# Patient Record
Sex: Female | Born: 1990 | Race: Black or African American | Hispanic: No | Marital: Single | Smoking: Never smoker
Health system: Southern US, Community
[De-identification: ages and names within clinical notes are randomized; demographics above are authoritative.]

## PROBLEM LIST (undated history)

## (undated) DIAGNOSIS — O139 Gestational [pregnancy-induced] hypertension without significant proteinuria, unspecified trimester: Secondary | ICD-10-CM

## (undated) DIAGNOSIS — Z5189 Encounter for other specified aftercare: Secondary | ICD-10-CM

## (undated) HISTORY — DX: Encounter for other specified aftercare: Z51.89

## (undated) HISTORY — PX: HAND SURGERY: SHX662

## (undated) HISTORY — DX: Gestational (pregnancy-induced) hypertension without significant proteinuria, unspecified trimester: O13.9

---
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
## 2009-06-02 ENCOUNTER — Emergency Department: Payer: Self-pay | Admitting: Emergency Medicine

## 2009-06-20 ENCOUNTER — Emergency Department: Payer: Self-pay | Admitting: Internal Medicine

## 2011-01-10 ENCOUNTER — Emergency Department: Payer: Self-pay | Admitting: Unknown Physician Specialty

## 2011-04-02 ENCOUNTER — Emergency Department: Payer: Self-pay | Admitting: Emergency Medicine

## 2011-04-02 LAB — URINALYSIS, COMPLETE
Bilirubin,UR: NEGATIVE
Blood: NEGATIVE
Glucose,UR: NEGATIVE mg/dL (ref 0–75)
Hyaline Cast: 2
Nitrite: NEGATIVE
Ph: 5 (ref 4.5–8.0)
Protein: NEGATIVE
RBC,UR: 16 /HPF (ref 0–5)
Specific Gravity: 1.03 (ref 1.003–1.030)
Squamous Epithelial: 13
WBC UR: 19 /HPF (ref 0–5)

## 2011-04-02 LAB — PREGNANCY, URINE: Pregnancy Test, Urine: POSITIVE m[IU]/mL

## 2011-04-02 LAB — HCG, QUANTITATIVE, PREGNANCY: Beta Hcg, Quant.: 2212 m[IU]/mL — ABNORMAL HIGH

## 2011-04-22 ENCOUNTER — Emergency Department: Payer: Self-pay | Admitting: Emergency Medicine

## 2011-04-22 LAB — URINALYSIS, COMPLETE
Bacteria: NONE SEEN
Bilirubin,UR: NEGATIVE
Hyaline Cast: 1
Ketone: NEGATIVE
Nitrite: NEGATIVE
RBC,UR: 7 /HPF (ref 0–5)
Specific Gravity: 1.025 (ref 1.003–1.030)
Squamous Epithelial: 6
WBC UR: 9 /HPF (ref 0–5)

## 2011-04-22 LAB — COMPREHENSIVE METABOLIC PANEL
Alkaline Phosphatase: 39 U/L — ABNORMAL LOW (ref 50–136)
Bilirubin,Total: 0.3 mg/dL (ref 0.2–1.0)
Calcium, Total: 8.5 mg/dL (ref 8.5–10.1)
Chloride: 106 mmol/L (ref 98–107)
Co2: 25 mmol/L (ref 21–32)
EGFR (African American): >60
EGFR (Non-African Amer.): >60
Osmolality: 277 (ref 275–301)
Potassium: 3.8 mmol/L (ref 3.5–5.1)
SGOT(AST): 16 U/L (ref 15–37)
Total Protein: 7.3 g/dL (ref 6.4–8.2)

## 2011-04-22 LAB — HCG, QUANTITATIVE, PREGNANCY: Beta Hcg, Quant.: 114693 m[IU]/mL — ABNORMAL HIGH

## 2011-04-22 LAB — CBC WITH DIFFERENTIAL/PLATELET
Basophil %: 0.2 %
Eosinophil #: 0.1 10*3/uL (ref 0.0–0.7)
Eosinophil %: 1 %
MCH: 27.1 pg (ref 26.0–34.0)
MCV: 83 fL (ref 80–100)
Monocyte %: 6 %
RBC: 4.41 10*6/uL (ref 3.80–5.20)
WBC: 9.6 10*3/uL (ref 3.6–11.0)

## 2011-04-23 LAB — URINE CULTURE

## 2011-06-17 ENCOUNTER — Emergency Department: Payer: Self-pay | Admitting: Emergency Medicine

## 2011-06-17 LAB — URINALYSIS, COMPLETE
Bilirubin,UR: NEGATIVE
Nitrite: NEGATIVE
Protein: 30
Specific Gravity: 1.029 (ref 1.003–1.030)

## 2011-09-10 ENCOUNTER — Observation Stay: Payer: Self-pay | Admitting: Obstetrics and Gynecology

## 2011-09-10 LAB — URINALYSIS, COMPLETE
Bilirubin,UR: NEGATIVE
Blood: NEGATIVE
Glucose,UR: NEGATIVE mg/dL (ref 0–75)
Nitrite: NEGATIVE
Ph: 5 (ref 4.5–8.0)
Protein: 30
RBC,UR: 2 /HPF (ref 0–5)
Specific Gravity: 1.03 (ref 1.003–1.030)
Squamous Epithelial: 6
WBC UR: 10 /HPF (ref 0–5)

## 2011-09-10 LAB — CBC WITH DIFFERENTIAL/PLATELET
Basophil #: 0 10*3/uL (ref 0.0–0.1)
Basophil %: 0.6 %
Eosinophil #: 0 10*3/uL (ref 0.0–0.7)
Eosinophil %: 0.1 %
HCT: 34.3 % — ABNORMAL LOW (ref 35.0–47.0)
HGB: 11.8 g/dL — ABNORMAL LOW (ref 12.0–16.0)
Lymphocyte #: 0.6 10*3/uL — ABNORMAL LOW (ref 1.0–3.6)
Lymphocyte %: 7.3 %
MCH: 28 pg (ref 26.0–34.0)
MCHC: 34.3 g/dL (ref 32.0–36.0)
MCV: 82 fL (ref 80–100)
Monocyte #: 0.6 x10 3/mm (ref 0.2–0.9)
Monocyte %: 7.1 %
Neutrophil #: 6.9 10*3/uL — ABNORMAL HIGH (ref 1.4–6.5)
Neutrophil %: 84.9 %
Platelet: 196 10*3/uL (ref 150–440)
RBC: 4.2 10*6/uL (ref 3.80–5.20)
RDW: 12.9 % (ref 11.5–14.5)
WBC: 8.1 10*3/uL (ref 3.6–11.0)

## 2011-09-10 LAB — BASIC METABOLIC PANEL
Anion Gap: 13 (ref 7–16)
BUN: 6 mg/dL — ABNORMAL LOW (ref 7–18)
Calcium, Total: 8.7 mg/dL (ref 8.5–10.1)
Chloride: 103 mmol/L (ref 98–107)
Co2: 20 mmol/L — ABNORMAL LOW (ref 21–32)
Creatinine: 0.4 mg/dL — ABNORMAL LOW (ref 0.60–1.30)
EGFR (African American): >60
EGFR (Non-African Amer.): >60
Glucose: 88 mg/dL (ref 65–99)
Osmolality: 269 (ref 275–301)
Potassium: 3.6 mmol/L (ref 3.5–5.1)
Sodium: 136 mmol/L (ref 136–145)

## 2011-09-11 LAB — URINE CULTURE

## 2011-10-23 ENCOUNTER — Observation Stay: Payer: Self-pay

## 2011-10-24 ENCOUNTER — Observation Stay: Payer: Self-pay

## 2011-10-31 ENCOUNTER — Observation Stay: Payer: Self-pay | Admitting: Obstetrics and Gynecology

## 2011-11-01 ENCOUNTER — Inpatient Hospital Stay: Payer: Self-pay | Admitting: Obstetrics and Gynecology

## 2011-11-01 LAB — CBC WITH DIFFERENTIAL/PLATELET
Basophil #: 0.1 10*3/uL (ref 0.0–0.1)
Basophil %: 0.3 %
Eosinophil #: 0 10*3/uL (ref 0.0–0.7)
Eosinophil %: 0.1 %
HCT: 32 % — ABNORMAL LOW (ref 35.0–47.0)
HGB: 11.2 g/dL — ABNORMAL LOW (ref 12.0–16.0)
Lymphocyte #: 1.5 10*3/uL (ref 1.0–3.6)
Lymphocyte %: 8 %
MCH: 27.8 pg (ref 26.0–34.0)
MCHC: 34.9 g/dL (ref 32.0–36.0)
MCV: 80 fL (ref 80–100)
Monocyte #: 1.5 x10 3/mm — ABNORMAL HIGH (ref 0.2–0.9)
Monocyte %: 7.8 %
Neutrophil #: 15.9 10*3/uL — ABNORMAL HIGH (ref 1.4–6.5)
Neutrophil %: 83.8 %
Platelet: 211 10*3/uL (ref 150–440)
RBC: 4.02 10*6/uL (ref 3.80–5.20)
RDW: 12.5 % (ref 11.5–14.5)
WBC: 19 10*3/uL — ABNORMAL HIGH (ref 3.6–11.0)

## 2011-11-02 LAB — HEMATOCRIT: HCT: 30.8 % — ABNORMAL LOW (ref 35.0–47.0)

## 2012-07-19 ENCOUNTER — Emergency Department: Payer: Self-pay | Admitting: Emergency Medicine

## 2012-07-19 LAB — URINALYSIS, COMPLETE
Glucose,UR: NEGATIVE mg/dL (ref 0–75)
Ketone: NEGATIVE
Nitrite: NEGATIVE
Ph: 6 (ref 4.5–8.0)
Protein: NEGATIVE
RBC,UR: 145 /HPF (ref 0–5)
Specific Gravity: 1.02 (ref 1.003–1.030)
Squamous Epithelial: 3
WBC UR: 111 /HPF (ref 0–5)

## 2012-07-21 LAB — URINE CULTURE

## 2012-11-26 IMAGING — US US OB < 14 WEEKS
1 series · 17 of 28 positions shown · non-contrast
Comparison: none

REASON FOR EXAM: early preg, cramping, bleeding
COMMENTS:

[Series 1: us ob < 14 weeks · 71 acquisitions, 17 frames shown]
[im 1/71]
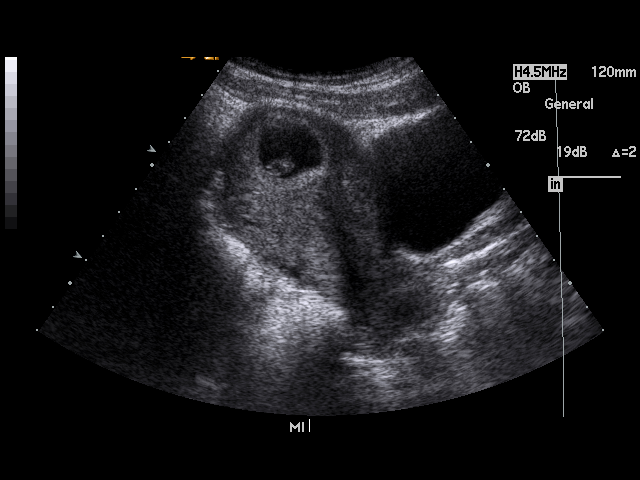
[im 6/71]
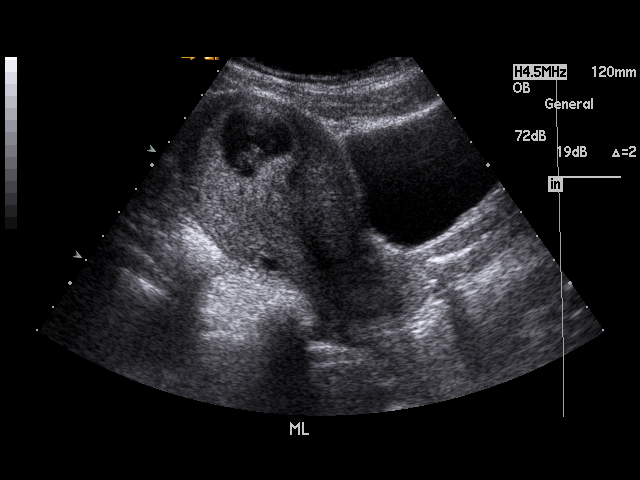
[im 11/71]
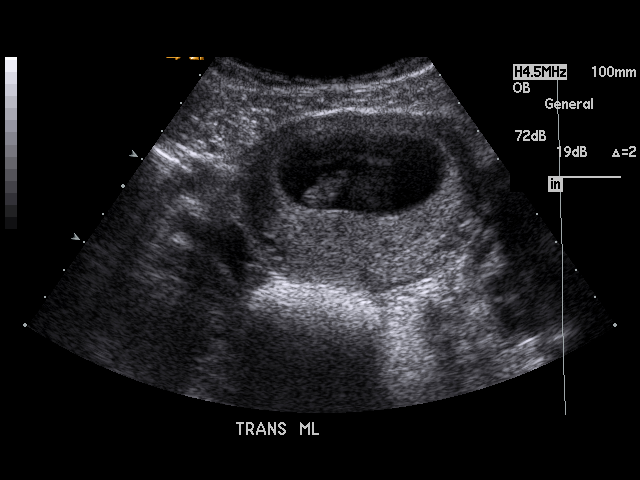
[im 13/71]
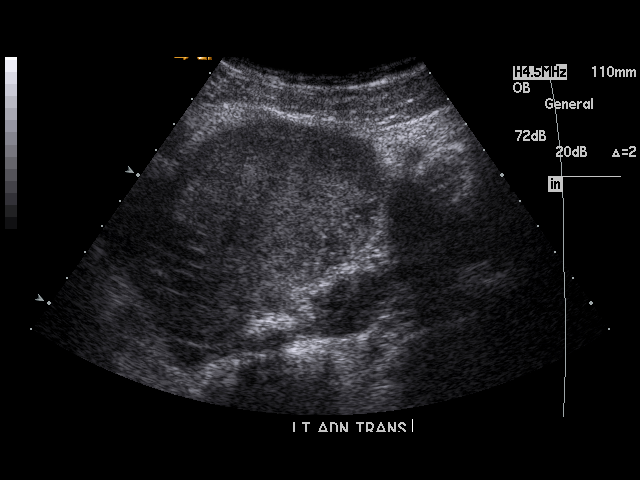
[im 19/71]
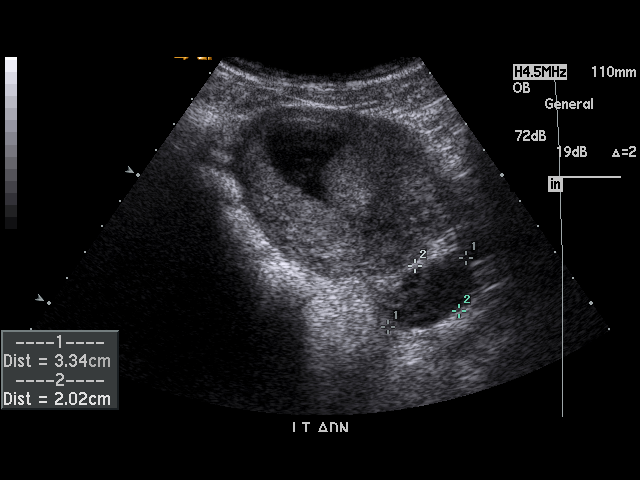
[im 24/71]
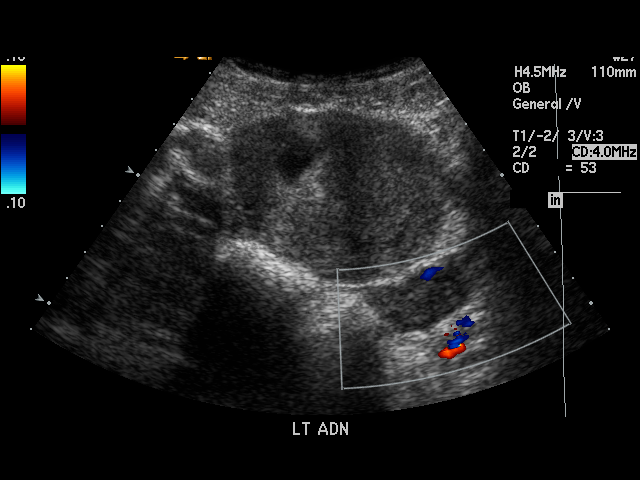
[im 26/71]
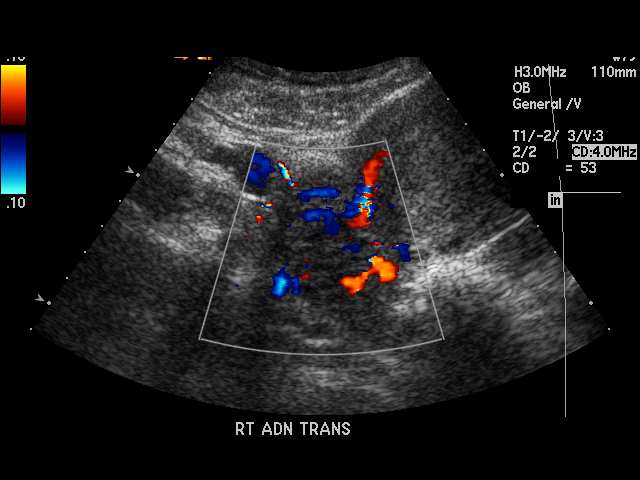
[im 32/71]
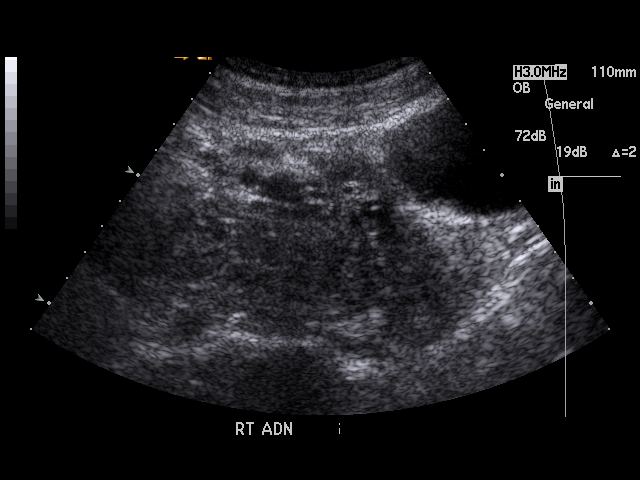
[im 37/71]
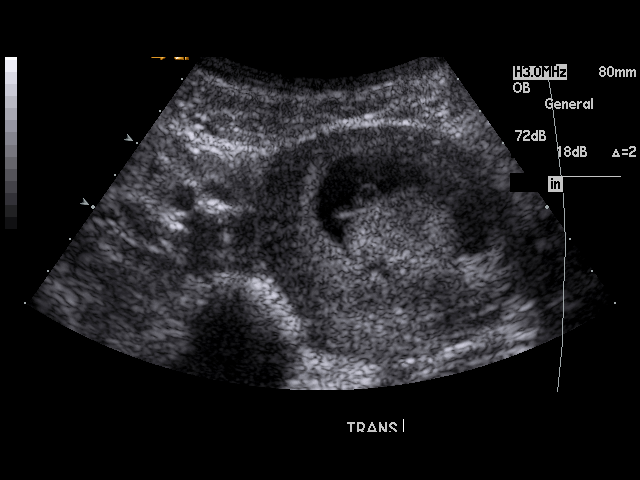
[im 39/71]
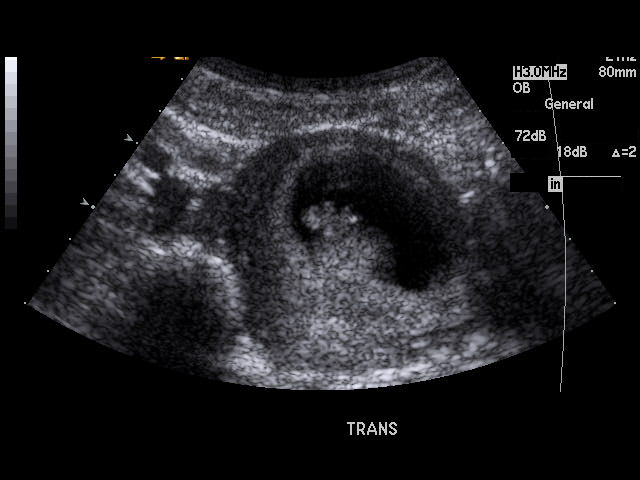
[im 45/71]
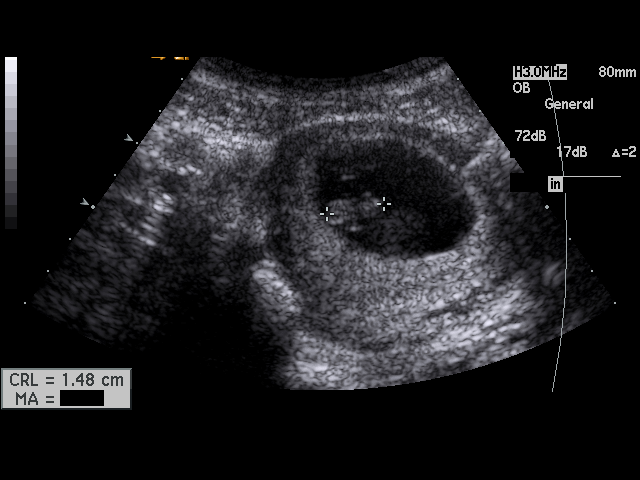
[im 47/71]
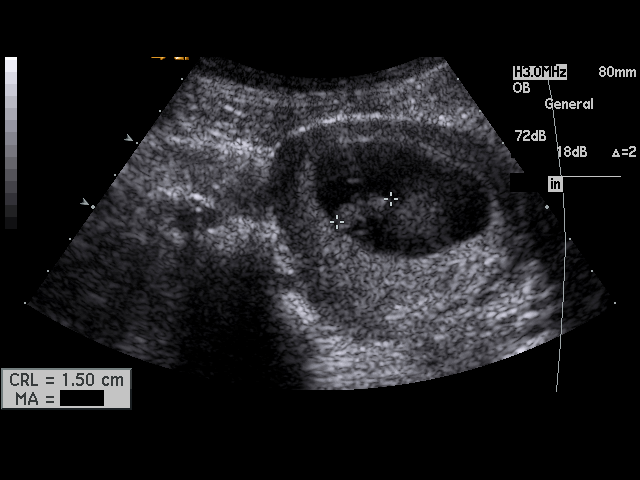
[im 52/71]
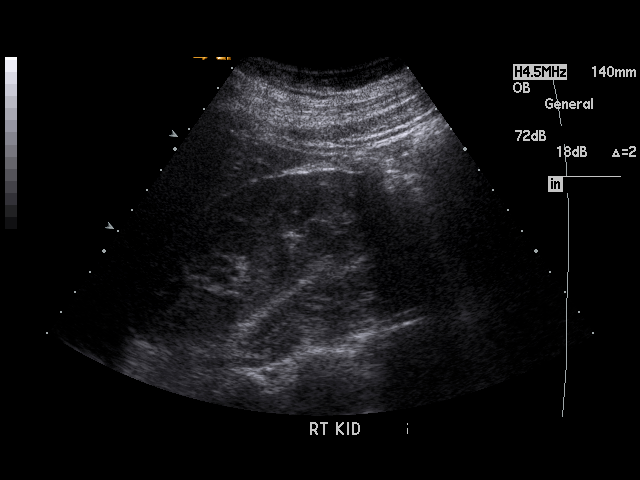
[im 58/71]
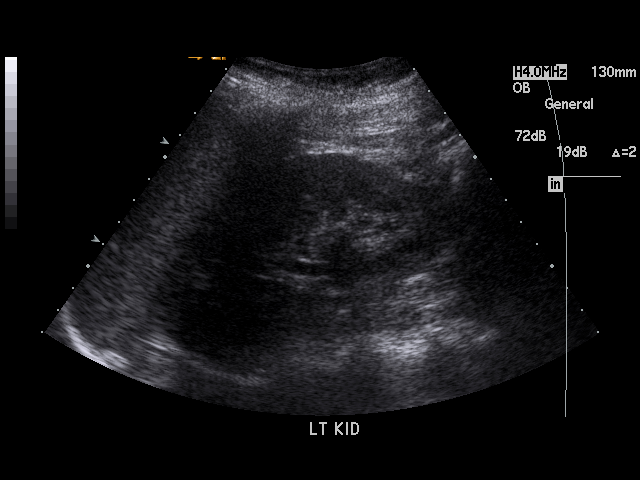
[im 60/71]
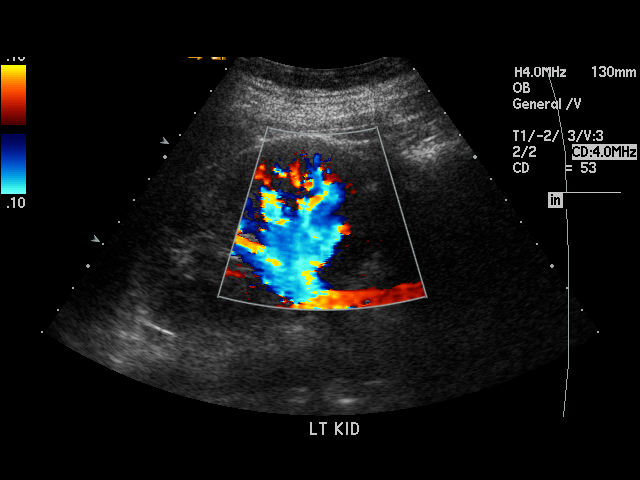
[im 65/71]
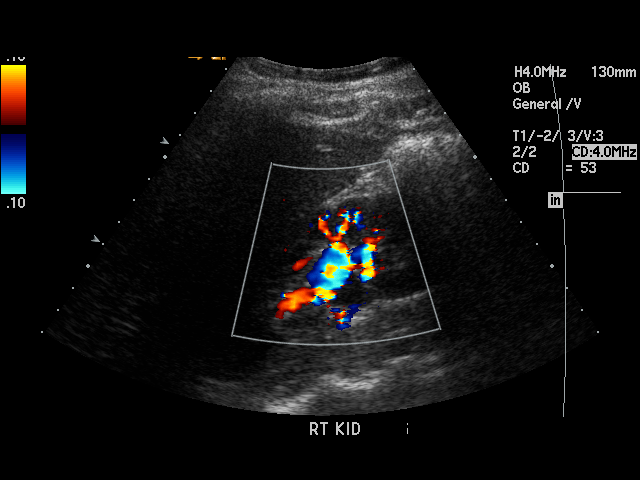
[im 71/71]
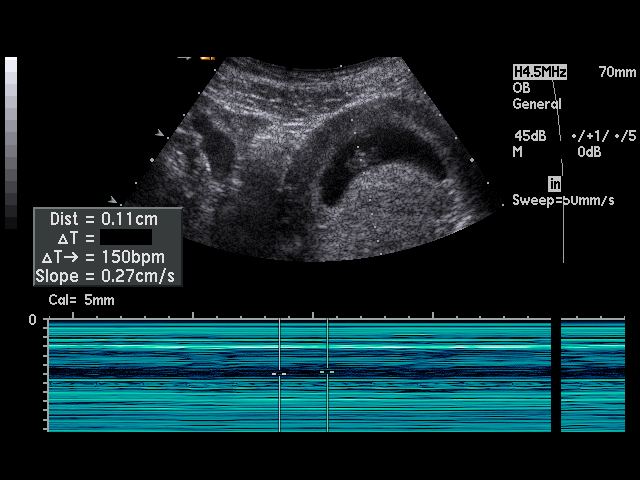

[17 of 28 positions shown; findings below may reference images not displayed]

PROCEDURE:     US  - US OB LESS THAN 14 WEEKS  - April 22, 2011 [DATE]

RESULT:     There is observed a single living intrauterine gestation. Embryo
heart rate was monitored at 150 beats per minute. A normal-appearing yolk
sac is visualized. The crown-rump length measures 1.5 cm which corresponds
to an estimated menstrual age of 7 weeks-3 days. Ultrasound EDD is Zelf

The maternal ovaries are visualized bilaterally. The right ovary measures
3.3 cm at maximum diameter and the left ovary also measures 3.3 cm at
maximum diameter. No abnormal adnexal masses are seen. No free fluid is
observed in the pelvis.
IMPRESSION: 1. There is a living intrauterine embryo of approximately 7 weeks-3 days
menstrual age.
2. A normal-appearing yolk sac is visualized.
3. No subchorionic bleed is identified.
4. The maternal pelvis is normal in appearance sonographically.
5. The maternal kidneys are visualized bilaterally with no hydronephrosis
identified.

## 2013-03-30 ENCOUNTER — Emergency Department: Payer: Self-pay | Admitting: Emergency Medicine

## 2013-10-11 ENCOUNTER — Emergency Department: Payer: Self-pay | Admitting: Emergency Medicine

## 2013-11-04 ENCOUNTER — Observation Stay: Payer: Self-pay

## 2013-11-06 ENCOUNTER — Inpatient Hospital Stay: Payer: Self-pay

## 2013-11-06 LAB — PIH PROFILE
Anion Gap: 11 (ref 7–16)
BUN: 3 mg/dL — ABNORMAL LOW (ref 7–18)
CALCIUM: 8.3 mg/dL — AB (ref 8.5–10.1)
CHLORIDE: 107 mmol/L (ref 98–107)
CO2: 22 mmol/L (ref 21–32)
Creatinine: 0.6 mg/dL (ref 0.60–1.30)
GLUCOSE: 92 mg/dL (ref 65–99)
HCT: 30.8 % — ABNORMAL LOW (ref 35.0–47.0)
HGB: 9.9 g/dL — AB (ref 12.0–16.0)
MCH: 25.3 pg — AB (ref 26.0–34.0)
MCHC: 32.1 g/dL (ref 32.0–36.0)
MCV: 79 fL — ABNORMAL LOW (ref 80–100)
OSMOLALITY: 276 (ref 275–301)
Platelet: 198 10*3/uL (ref 150–440)
Potassium: 3.5 mmol/L (ref 3.5–5.1)
RBC: 3.91 10*6/uL (ref 3.80–5.20)
RDW: 13.2 % (ref 11.5–14.5)
SGOT(AST): 11 U/L — ABNORMAL LOW (ref 15–37)
Sodium: 140 mmol/L (ref 136–145)
Uric Acid: 3.6 mg/dL (ref 2.6–6.0)
WBC: 7.6 10*3/uL (ref 3.6–11.0)

## 2013-11-06 LAB — PROTEIN / CREATININE RATIO, URINE
CREATININE, URINE: 192.9 mg/dL — AB (ref 30.0–125.0)
PROTEIN/CREAT. RATIO: 187 mg/g{creat} (ref 0–200)
Protein, Random Urine: 36 mg/dL — ABNORMAL HIGH (ref 0–12)

## 2013-11-06 LAB — DRUG SCREEN, URINE
AMPHETAMINES, UR SCREEN: NEGATIVE (ref ?–1000)
BARBITURATES, UR SCREEN: NEGATIVE (ref ?–200)
BENZODIAZEPINE, UR SCRN: NEGATIVE (ref ?–200)
Cannabinoid 50 Ng, Ur ~~LOC~~: NEGATIVE (ref ?–50)
Cocaine Metabolite,Ur ~~LOC~~: NEGATIVE (ref ?–300)
MDMA (ECSTASY) UR SCREEN: NEGATIVE (ref ?–500)
Methadone, Ur Screen: NEGATIVE (ref ?–300)
Opiate, Ur Screen: NEGATIVE (ref ?–300)
PHENCYCLIDINE (PCP) UR S: NEGATIVE (ref ?–25)
TRICYCLIC, UR SCREEN: NEGATIVE (ref ?–1000)

## 2013-11-08 LAB — HEMATOCRIT: HCT: 29.7 % — ABNORMAL LOW (ref 35.0–47.0)

## 2013-11-10 LAB — GC/CHLAMYDIA PROBE AMP

## 2014-02-06 ENCOUNTER — Emergency Department: Payer: Self-pay | Admitting: Emergency Medicine

## 2014-02-09 LAB — BETA STREP CULTURE(ARMC)

## 2014-04-30 ENCOUNTER — Emergency Department: Admit: 2014-04-30 | Disposition: A | Discharge status: Home / Self Care | Payer: Self-pay | Admitting: Family Medicine

## 2014-05-02 LAB — BETA STREP CULTURE(ARMC)

## 2014-05-24 NOTE — H&P (Signed)
L&D Evaluation:  History:  HPI Pt is a 24 yo G2P0101 at 37.[redacted] weeks GA with an EDC of 11/24/13 who presents for IOL for gestational HTN. She has had several documented BP that have had an elevate diastolic at 90 at a 36.2 and 36.5 week visit. At her last visit on 10/23 her BP was 140/92. She had a PIH work up that was normal 5 days ago. She reports a headache and nausea at her visit on 10/23. Her prenatal course has been unremarkable. She has a history of PTB at 34 weeks. She is A+, RI, VI, GBS negative, and declined her Tdap.   Presents with IOL for gHTN   Patient's Medical History anemia   Patient's Surgical History none   Medications Pre Natal Vitamins  Iron   Allergies NKDA   Social History none   Family History Non-Contributory   ROS:  ROS All systems were reviewed.  HEENT, CNS, GI, GU, Respiratory, CV, Renal and Musculoskeletal systems were found to be normal.   Exam:  Vital Signs 130-140's/70-80's currently 144/86 denies HA   Urine Protein not completed, PC ratio 187   General no apparent distress   Mental Status clear   Chest clear   Heart normal sinus rhythm   Abdomen gravid, non-tender   Back no CVAT   Reflexes 1+   Mebranes Intact   FHT normal rate with no decels, 130's +accels, no decels, moderate variability   Ucx irregular, 3-5 minutes on pitocin   Skin dry, no lesions, no rashes   Lymph no lymphadenopathy   Other UDS negative  PIH panel normal- plt-198, uric acid- 3.6, creatinine- 0.60, SGOT-11, HGB- 9.9, HCT- 30.8   Impression:  Impression evaluation for PIH, reactive NST, IUP at 37.3, gestational HTN, anemia due to iron deficiency   Plan:  Plan EFM/NST, pitocin, potential AROM, epidural for pain relief, anticipate vaginal delivery   Follow Up Appointment need to schedule   Electronic Signatures: Jannet MantisSubudhi, Nayelis Bonito (CNM)  (Signed 24-Oct-15 11:25)  Authored: L&D Evaluation   Last Updated: 24-Oct-15 11:25 by Jannet MantisSubudhi, Simmie Garin (CNM)

## 2014-05-24 NOTE — H&P (Signed)
L&D Evaluation:  History Expanded:   HPI 24 yo G1 whose EDC = 12/07/11.  [facility]. She has had ongoing contractions and has changed over the days from 2 to 6 cm dilated.  Pt is + GBS.Pt had a posw FFN 10/7.    Blood Type (Maternal) A positive    Group B Strep Results Maternal (Result >5wks must be treated as unknown) positive    Maternal HIV Negative    Maternal Syphilis Ab Nonreactive    Maternal Varicella Immune    Rubella Results (Maternal) immune    EDC 11-Dec-2011    Presents with see above    Patient's Medical History No Chronic Illness    Patient's Surgical History none    Medications Pre Natal Vitamins    Allergies NKDA    Social History none    Current Prenatal Course Notable For PreTerm Labor   Exam:   Vital Signs stable    General no apparent distress    Mental Status clear    Chest clear    Heart normal sinus rhythm    Abdomen gravid, non-tender    Pelvic 6 cm dilatedc   Impression:   Impression PTL   Plan:   Comments I have had a long and careful discusssion with this patient and her partner about the pros and cons of each choice, ie 1 Send her home and keep our fingers crossed 2 Keep in hospital until she delivers 3 Cover for her beta strep and commit to delivery via arom Mujtual decision made for plan 3   Electronic Signatures: Towana Badgerosenow, Philip J (MD)  (Signed 18-Oct-13 16:35)  Authored: L&D Evaluation   Last Updated: 18-Oct-13 16:35 by Towana Badgerosenow, Philip J (MD)

## 2014-05-24 NOTE — H&P (Signed)
L&D Evaluation:  History Expanded:   HPI 24 yo G1 whose EDC = 12/07/11.  Pt presents at 33 5/7 weeks from office with report of 4 cm cervix. Pt was 1 cm with + FFN on 10/21/11, cervix was 3 cm yesterday and pt monitored for several hours with no change. BMZ given at noon yesterday and she also received a dose of Terbutaline around 1500 yesterday. Pt was d/c home in the evening, she reports that ctx continued overnight and this am. She was checked at the office and was found to be 4 cm this am at 0930.  [facility] +Tr[facility],    Blood Type (Maternal) A positive    Group B Strep Results Maternal (Result >5wks must be treated as unknown) unknown/result > 5 weeks ago    Maternal HIV Negative    Maternal Syphilis Ab Nonreactive    Maternal Varicella Immune    Rubella Results (Maternal) immune    EDC 07-Dec-2011    Presents with contractions    Patient's Medical History No Chronic Illness    Patient's Surgical History none    Medications Pre Natal Vitamins    Allergies NKDA    Social History none   Exam:   Vital Signs stable    General no apparent distress    Mental Status clear    Chest clear    Heart normal sinus rhythm    Abdomen gravid, NT to palpation    Estimated Fetal Weight Average for gestational age    Edema no edema    Pelvic no external lesions, 3.5/75/-3    Mebranes Intact    FHT normal rate with no decels    Ucx absent    Skin dry   Impression:   Impression PTL   Plan:   Comments 2nd BMZ today at noon. Monitor for contractions and cervical change After discussing possible managment options with pt (IV fluids, po Procardia, etc) pt declines intervention at this time. Explained the importance of delaying delivery if possible at this time. At this time however, it seems that contractions are sporadic/rare. May consider Neonatology consult prn. Will plan to recheck cervix in several hours.   Electronic Signatures: Dorothia Passmore, Marta Lamasamara K  (CNM)  (Signed 10-Oct-13 12:51)  Authored: L&D Evaluation   Last Updated: 10-Oct-13 12:51 by Vella KohlerBrothers, Aleck Locklin K (CNM)

## 2014-05-24 NOTE — H&P (Signed)
L&D Evaluation:  History:   HPI 24 year old G1 P0 with EDC=12/07/11 by LMP=03/02/2011 presented at 4227 3/7 weeks with c/o nausea since breakfast yesterday (chicken biscuit at [employer]) followed by onset of vomiting at 1600 last night. Last vomited at midnight after trying to drink water. Has not attempted to drink or eat anything since then. Denies diarrhea. Has had hot and cold chills. Has had heartburn with the pregnancy aggrevated by spicy food. Baby active. Denies abdominal pain, VB, or LOF.    Presents with nausea/vomiting    Patient's Medical History No Chronic Illness    Patient's Surgical History wisdon teeth extraction    Medications Pre Natal Vitamins    Allergies NKDA    Social History none    Family History Non-Contributory   ROS:   ROS see HPI   Exam:   Vital Signs stable  116/69    Urine Protein pending    General no apparent distress    Mental Status clear    Chest clear    Heart normal sinus rhythm, no murmur/gallop/rubs    Abdomen gravid, non-tender, BS active    Edema no edema    Reflexes 1+    Mebranes Intact    FHT normal rate with no decels, 140s with accels to 150s to 160s    Fetal Heart Rate 142    Ucx absent    Skin petichiae of face   Impression:   Impression IUP at 27 3/7 with nausea and vomiting. Possible  gastroenteritis   Plan:   Plan IV fluids and IV antiemetics. CBC, lytes, UA   Electronic Signatures: Trinna BalloonGutierrez, Glenisha Gundry L (CNM)  (Signed 27-Aug-13 08:12)  Authored: L&D Evaluation   Last Updated: 27-Aug-13 08:12 by Trinna BalloonGutierrez, Finlay Godbee L (CNM)

## 2014-05-24 NOTE — H&P (Signed)
L&D Evaluation:  History Expanded:   HPI 24 yo G1 whose EDC = 12/07/11.  Pt presents at 33+ weeks gestation with the Hx of contractions for the last several days.  Exam 2 days ago. patient 1 cm with a positive FFn.  Rexaminatgigon today - patient is 3 cm dilated.    [facility]    Presents with contractions    Patient's Medical History No Chronic Illness    Patient's Surgical History none    Medications Pre Natal Vitamins    Allergies NKDA    Social History none   Exam:   General no apparent distress    Mental Status clear    Chest clear    Heart normal sinus rhythm    Abdomen gravid, non-tender    Estimated Fetal Weight Average for gestational age    Pelvic 3 cm    Mebranes Intact    FHT normal rate with no decels   Impression:   Impression PTL, IUP 33+ weeks,   Plan:   Comments Will hydrfate, give betameethasone, potential tocolysis if contraction   Electronic Signatures: Towana Badgerosenow, Philip J (MD)  (Signed 09-Oct-13 11:43)  Authored: L&D Evaluation   Last Updated: 09-Oct-13 11:43 by Towana Badgerosenow, Philip J (MD)

## 2014-12-09 ENCOUNTER — Emergency Department
Admission: EM | Admit: 2014-12-09 | Discharge: 2014-12-09 | Disposition: A | Discharge status: Home / Self Care | Payer: Self-pay | Attending: Emergency Medicine | Admitting: Emergency Medicine

## 2014-12-09 DIAGNOSIS — N390 Urinary tract infection, site not specified: Secondary | ICD-10-CM | POA: Insufficient documentation

## 2014-12-09 LAB — URINALYSIS COMPLETE WITH MICROSCOPIC (ARMC ONLY)
BILIRUBIN URINE: NEGATIVE
Bacteria, UA: NONE SEEN
Glucose, UA: NEGATIVE mg/dL
Hgb urine dipstick: NEGATIVE
NITRITE: NEGATIVE
Protein, ur: NEGATIVE mg/dL
SPECIFIC GRAVITY, URINE: 1.027 (ref 1.005–1.030)
pH: 6 (ref 5.0–8.0)

## 2014-12-09 MED ORDER — CIPROFLOXACIN HCL 500 MG PO TABS: 500.0000 mg | ORAL_TABLET | Freq: Two times a day (BID) | ORAL | Status: DC

## 2014-12-09 NOTE — ED Notes (Signed)
Pt c/o lower back pain with painful urination for the past 2 days.. States "I think I having a UTI"

## 2014-12-09 NOTE — ED Notes (Signed)
AAOx3.  Skin warm and dry.  NAD.  D/C home 

## 2014-12-09 NOTE — Discharge Instructions (Signed)

## 2014-12-09 NOTE — ED Provider Notes (Signed)
[facility] Emergency Department Provider Note  ____________________________________________  Time seen: On arrival  I have reviewed the triage vital signs and the nursing notes.   HISTORY  Chief Complaint Urinary Frequency    HPI Tracey Christensen is a 24 y.o. female who presents with complaints of burning with urination. She reports this started 2 days ago. She believes it is related to some using new soap. She denies neck pain. No fevers. No nausea or vomiting. No vaginal discharge. No abdominal pain.    History reviewed. No pertinent past medical history.  There are no active problems to display for this patient.   History reviewed. No pertinent past surgical history.  Current Outpatient Rx  Name  Route  Sig  Dispense  Refill  . ciprofloxacin (CIPRO) 500 MG tablet   Oral   Take 1 tablet (500 mg total) by mouth 2 (two) times daily.   14 tablet   0     Allergies Review of patient's allergies indicates no known allergies.  No family history on file.  Social History Social History  Substance Use Topics  . Smoking status: Never Smoker   . Smokeless tobacco: None  . Alcohol Use: No    Review of Systems  Constitutional: Negative for fever.  ENT: Negative for sore throat   Genitourinary: Negative for vaginal discharge Musculoskeletal: Negative for back pain. Skin: Negative for rash.    ____________________________________________   PHYSICAL EXAM:  VITAL SIGNS: ED Triage Vitals  Enc Vitals Group     BP 12/09/14 1455 133/90 mmHg     Pulse Rate 12/09/14 1455 75     Resp 12/09/14 1455 15     Temp 12/09/14 1455 98.3 F (36.8 C)     Temp Source 12/09/14 1455 Oral     SpO2 12/09/14 1455 99 %     Weight 12/09/14 1455 190 lb (86.183 kg)     Height 12/09/14 1455 5\' 6"  (1.676 m)     Head Cir --      Peak Flow --      Pain Score 12/09/14 1456 5     Pain Loc --      Pain Edu? --      Excl. in GC? --      Constitutional:  Alert and oriented. Well appearing and in no distress. Eyes: Conjunctivae are normal.  ENT   Head: Normocephalic and atraumatic.   Mouth/Throat: Mucous membranes are moist.   Gastrointestinal: Soft and non-tender in all quadrants. No distention. There is no CVA tenderness.   Neurologic:  Normal speech and language. No gross focal neurologic deficits are appreciated. Skin:  Skin is warm, dry and intact. No rash noted. Psychiatric: Mood and affect are normal. Patient exhibits appropriate insight and judgment.  ____________________________________________    LABS (pertinent positives/negatives)  Labs Reviewed  URINALYSIS COMPLETEWITH MICROSCOPIC ([facility] ONLY) - Abnormal; Notable for the following:    Color, Urine YELLOW (*)    APPearance HAZY (*)    Ketones, ur 1+ (*)    Leukocytes, UA 3+ (*)    Squamous Epithelial / LPF 6-30 (*)    All other components within normal limits    ____________________________________________     ____________________________________________    RADIOLOGY I have personally reviewed any xrays that were ordered on this patient: None  ____________________________________________   PROCEDURES  Procedure(s) performed: none   ____________________________________________   INITIAL IMPRESSION / ASSESSMENT AND PLAN / ED COURSE  Pertinent labs & imaging results that were available  during my care of the patient were reviewed by me and considered in my medical decision making (see chart for details).  Patient well-appearing no acute distress. Benign exam. She uses the Implanon for birth control. UA consistent with UTI, will treat with abx  ____________________________________________   FINAL CLINICAL IMPRESSION(S) / ED DIAGNOSES  Final diagnoses:  UTI (lower urinary tract infection)      Jene Every, MD 12/09/14 1620

## 2015-01-02 ENCOUNTER — Emergency Department
Admission: EM | Admit: 2015-01-02 | Discharge: 2015-01-02 | Disposition: A | Discharge status: Home / Self Care | Payer: Self-pay | Attending: Emergency Medicine | Admitting: Emergency Medicine

## 2015-01-02 ENCOUNTER — Encounter: Payer: Self-pay | Admitting: Emergency Medicine

## 2015-01-02 DIAGNOSIS — J029 Acute pharyngitis, unspecified: Secondary | ICD-10-CM | POA: Insufficient documentation

## 2015-01-02 DIAGNOSIS — Z792 Long term (current) use of antibiotics: Secondary | ICD-10-CM | POA: Insufficient documentation

## 2015-01-02 LAB — POCT RAPID STREP A: STREPTOCOCCUS, GROUP A SCREEN (DIRECT): NEGATIVE

## 2015-01-02 NOTE — ED Notes (Signed)
States she developed a sore throat 2 days  ago  Increased pain with swallowing

## 2015-01-02 NOTE — ED Provider Notes (Signed)
[facility] Emergency Department Provider Note   ____________________________________________  Time seen: Approximately 9:19 AM  I have reviewed the triage vital signs and the nursing notes.   HISTORY  Chief Complaint Sore Throat   HPI Tracey Christensen is a 24 y.o. female is here with complaint of sore throat 2 days. Patient states that she has had some congestion but is not taking any over-the-counter medication for this. She denies any known fever. She states she gets tonsillitis often.She is unaware of any exposure to strep throat. She also has had an occasional cough and runny nose. She rates her discomfort as 9 out of 10.   History reviewed. No pertinent past medical history.  There are no active problems to display for this patient.   History reviewed. No pertinent past surgical history.  Current Outpatient Rx  Name  Route  Sig  Dispense  Refill  . ciprofloxacin (CIPRO) 500 MG tablet   Oral   Take 1 tablet (500 mg total) by mouth 2 (two) times daily.   14 tablet   0     Allergies Review of patient's allergies indicates no known allergies.  No family history on file.  Social History Social History  Substance Use Topics  . Smoking status: Never Smoker   . Smokeless tobacco: None  . Alcohol Use: No    Review of Systems Constitutional: No fever/chills Eyes: No visual changes. ENT: Positive sore throat. Cardiovascular: Denies chest pain. Respiratory: Denies shortness of breath. Occasional cough Gastrointestinal:   No nausea, no vomiting.  No diarrhea.   Musculoskeletal: Negative for back pain. Skin: Negative for rash. Neurological: Negative for headaches, focal weakness or numbness.  10-point ROS otherwise negative.  ____________________________________________   PHYSICAL EXAM:  VITAL SIGNS: ED Triage Vitals  Enc Vitals Group     BP 01/02/15 0913 136/87 mmHg     Pulse Rate 01/02/15 0913 88     Resp 01/02/15 0913 16   Temp 01/02/15 0913 99.1 F (37.3 C)     Temp Source 01/02/15 0913 Oral     SpO2 01/02/15 0913 97 %     Weight 01/02/15 0913 190 lb (86.183 kg)     Height 01/02/15 0913 5\' 7"  (1.702 m)     Head Cir --      Peak Flow --      Pain Score 01/02/15 0916 9     Pain Loc --      Pain Edu? --      Excl. in GC? --     Constitutional: Alert and oriented. Well appearing and in no acute distress. Eyes: Conjunctivae are normal. PERRL. EOMI. Head: Atraumatic. Nose: Mild congestion/rhinnorhea.   EACs and TMs are clear bilaterally. Mouth/Throat: Mucous membranes are moist.  Oropharynx non-erythematous. No exudate was seen. There is mild posterior drainage noted. Neck: No stridor.  Supple Hematological/Lymphatic/Immunilogical: No cervical lymphadenopathy. Cardiovascular: Normal rate, regular rhythm. Grossly normal heart sounds.  Good peripheral circulation. Respiratory: Normal respiratory effort.  No retractions. Lungs CTAB. Gastrointestinal: Soft and nontender. No distention.  Musculoskeletal: No lower extremity tenderness nor edema.  No joint effusions. Neurologic:  Normal speech and language. No gross focal neurologic deficits are appreciated. No gait instability. Skin:  Skin is warm, dry and intact. No rash noted. Psychiatric: Mood and affect are normal. Speech and behavior are normal.  ____________________________________________   LABS (all labs ordered are listed, but only abnormal results are displayed)  Labs Reviewed  POCT RAPID STREP A    PROCEDURES  Procedure(s) performed: None  Critical Care performed: No  ____________________________________________   INITIAL IMPRESSION / ASSESSMENT AND PLAN / ED COURSE  Pertinent labs & imaging results that were available during my care of the patient were reviewed by me and considered in my medical decision making (see chart for details).  Patient was told to obtain over-the-counter decongestant and began taking. She is to increase  fluids. Also Tylenol or ibuprofen as needed for throat pain. ____________________________________________   FINAL CLINICAL IMPRESSION(S) / ED DIAGNOSES  Final diagnoses:  Acute pharyngitis, unspecified etiology      Tommi Rumps, PA-C 01/02/15 7829  Jennye Moccasin, MD 01/02/15 [phone]

## 2015-01-02 NOTE — Discharge Instructions (Signed)
Tylenol or ibuprofen if needed for throat pain. Obtain decongestant over-the-counter for nasal congestion and drainage. Increase fluids. [facility] if any [facility] problems.

## 2015-01-25 ENCOUNTER — Emergency Department
Admission: EM | Admit: 2015-01-25 | Discharge: 2015-01-25 | Disposition: A | Discharge status: Home / Self Care | Payer: No Typology Code available for payment source | Attending: Emergency Medicine | Admitting: Emergency Medicine

## 2015-01-25 DIAGNOSIS — S161XXA Strain of muscle, fascia and tendon at neck level, initial encounter: Secondary | ICD-10-CM | POA: Insufficient documentation

## 2015-01-25 DIAGNOSIS — Y998 Other external cause status: Secondary | ICD-10-CM | POA: Insufficient documentation

## 2015-01-25 DIAGNOSIS — S199XXA Unspecified injury of neck, initial encounter: Secondary | ICD-10-CM | POA: Diagnosis present

## 2015-01-25 DIAGNOSIS — Y9389 Activity, other specified: Secondary | ICD-10-CM | POA: Insufficient documentation

## 2015-01-25 DIAGNOSIS — Y92410 Unspecified street and highway as the place of occurrence of the external cause: Secondary | ICD-10-CM | POA: Diagnosis not present

## 2015-01-25 DIAGNOSIS — Z792 Long term (current) use of antibiotics: Secondary | ICD-10-CM | POA: Diagnosis not present

## 2015-01-25 MED ORDER — METHOCARBAMOL 500 MG PO TABS
1000.0000 mg | ORAL_TABLET | Freq: Once | ORAL | Status: AC
  Administered 2015-01-25: 1000 mg via ORAL
  Filled 2015-01-25: qty 2

## 2015-01-25 MED ORDER — TRAMADOL HCL 50 MG PO TABS: 50.0000 mg | ORAL_TABLET | Freq: Four times a day (QID) | ORAL | Status: DC | PRN

## 2015-01-25 MED ORDER — CYCLOBENZAPRINE HCL 10 MG PO TABS: 10.0000 mg | ORAL_TABLET | Freq: Three times a day (TID) | ORAL | Status: DC | PRN

## 2015-01-25 MED ORDER — IBUPROFEN 800 MG PO TABS: 800.0000 mg | ORAL_TABLET | Freq: Three times a day (TID) | ORAL | Status: DC | PRN

## 2015-01-25 MED ORDER — TRAMADOL HCL 50 MG PO TABS
50.0000 mg | ORAL_TABLET | Freq: Once | ORAL | Status: AC
  Administered 2015-01-25: 50 mg via ORAL
  Filled 2015-01-25: qty 1

## 2015-01-25 NOTE — Discharge Instructions (Signed)
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
## 2015-06-22 ENCOUNTER — Emergency Department
Admission: EM | Admit: 2015-06-22 | Discharge: 2015-06-22 | Disposition: A | Discharge status: Home / Self Care | Payer: No Typology Code available for payment source | Attending: Emergency Medicine | Admitting: Emergency Medicine

## 2015-06-22 ENCOUNTER — Encounter: Payer: Self-pay | Admitting: Emergency Medicine

## 2015-06-22 DIAGNOSIS — J029 Acute pharyngitis, unspecified: Secondary | ICD-10-CM | POA: Insufficient documentation

## 2015-06-22 DIAGNOSIS — Z79899 Other long term (current) drug therapy: Secondary | ICD-10-CM | POA: Insufficient documentation

## 2015-06-22 DIAGNOSIS — Z792 Long term (current) use of antibiotics: Secondary | ICD-10-CM | POA: Insufficient documentation

## 2015-06-22 LAB — POCT RAPID STREP A: Streptococcus, Group A Screen (Direct): NEGATIVE

## 2015-06-22 MED ORDER — MAGIC MOUTHWASH W/LIDOCAINE: 5.0000 mL | Freq: Four times a day (QID) | ORAL | Status: DC | PRN

## 2015-06-22 MED ORDER — FLUTICASONE PROPIONATE 50 MCG/ACT NA SUSP: 2.0000 | Freq: Every day | NASAL | Status: DC

## 2015-06-22 NOTE — ED Provider Notes (Signed)
[facility] Emergency Department Provider Note  ____________________________________________  Time seen: Approximately 8:09 PM  I have reviewed the triage vital signs and the nursing notes.   HISTORY  Chief Complaint Sore Throat    HPI Tracey Christensen is a 25 y.o. female , NAD, presents to emergency with 2-3 day history of sore throat. States it hurts to swallow and talk. Has not noted any redness, swelling, exudates. Denies fevers, chills, body aches, rash, abdominal pain, nausea, vomiting. Has not had any nasal congestion, runny nose, ear pain, sinus pressure. States she has a history of recurrent tonsillitis and feels similar. Has not taken anything over-the-counter for her symptoms at this time. Has attempted warm salt water gargles.   History reviewed. No pertinent past medical history.  There are no active problems to display for this patient.   History reviewed. No pertinent past surgical history.  Current Outpatient Rx  Name  Route  Sig  Dispense  Refill  . ciprofloxacin (CIPRO) 500 MG tablet   Oral   Take 1 tablet (500 mg total) by mouth 2 (two) times daily.   14 tablet   0   . cyclobenzaprine (FLEXERIL) 10 MG tablet   Oral   Take 1 tablet (10 mg total) by mouth every 8 (eight) hours as needed for muscle spasms.   15 tablet   0   . fluticasone (FLONASE) 50 MCG/ACT nasal spray   Each Nare   Place 2 sprays into both nostrils daily.   16 g   0   . ibuprofen (ADVIL,MOTRIN) 800 MG tablet   Oral   Take 1 tablet (800 mg total) by mouth every 8 (eight) hours as needed.   30 tablet   0   . magic mouthwash w/lidocaine SOLN   Oral   Take 5 mLs by mouth 4 (four) times daily as needed for mouth pain.   240 mL   0     Please mix 80mL diphenhydramine, 80mL nystatin, 80 ...   . traMADol (ULTRAM) 50 MG tablet   Oral   Take 1 tablet (50 mg total) by mouth every 6 (six) hours as needed for moderate pain.   12 tablet   0      Allergies Review of patient's allergies indicates no known allergies.  No family history on file.  Social History Social History  Substance Use Topics  . Smoking status: Never Smoker   . Smokeless tobacco: None  . Alcohol Use: No     Review of Systems  Constitutional: No fever/chills, fatigue ENT: Positive sore throat. No nasal congestion, runny nose, ear pain, sinus pressure Cardiovascular: No chest pain. Respiratory: No cough, chest congestion. No shortness of breath. No wheezing.  Gastrointestinal: No abdominal pain.  No nausea, vomiting.   Musculoskeletal: Negative for general myalgias.  Skin: Negative for rash. Neurological: Negative for headaches, focal weakness or numbness. 10-point ROS otherwise negative.  ____________________________________________   PHYSICAL EXAM:  VITAL SIGNS: ED Triage Vitals  Enc Vitals Group     BP 06/22/15 1843 138/92 mmHg     Pulse Rate 06/22/15 1843 73     Resp 06/22/15 1843 18     Temp 06/22/15 1843 98.8 F (37.1 C)     Temp Source 06/22/15 1843 Oral     SpO2 06/22/15 1843 100 %     Weight --      Height --      Head Cir --      Peak Flow --  Pain Score 06/22/15 1842 10     Pain Loc --      Pain Edu? --      Excl. in GC? --      Constitutional: Alert and oriented. Well appearing and in no acute distress. Eyes: Conjunctivae are normal.  Head: Atraumatic. ENT:      Ears: TMs visualized bilaterally without erythema, bulging, effusion, perforation.      Nose: No congestion/rhinnorhea.      Mouth/Throat: Mucous membranes are moist. Mild injection noted to the tonsillar area but no swelling or exudate. Posterior pharynx without erythema, swelling, patient's. Uvula is midline. Trace clear postnasal drip. Neck: Supple with full range of motion Hematological/Lymphatic/Immunilogical: No cervical lymphadenopathy. Cardiovascular: Normal rate, regular rhythm. Normal S1 and S2.  Good peripheral circulation. Respiratory:  Normal respiratory effort without tachypnea or retractions. Lungs CTAB with breath sounds noted in all lung fields. Neurologic:  Normal speech and language. No gross focal neurologic deficits are appreciated.  Skin:  Skin is warm, dry and intact. No rash noted. Psychiatric: Mood and affect are normal. Speech and behavior are normal. Patient exhibits appropriate insight and judgement.   ____________________________________________   LABS (all labs ordered are listed, but only abnormal results are displayed)  Labs Reviewed  POCT RAPID STREP A   ____________________________________________  EKG  None ____________________________________________  RADIOLOGY  None ____________________________________________    PROCEDURES  Procedure(s) performed: None    Medications - No data to display   ____________________________________________   INITIAL IMPRESSION / ASSESSMENT AND PLAN / ED COURSE  Pertinent lab results that were available during my care of the patient were reviewed by me and considered in my medical decision making (see chart for details). Group A strep culture was sent to lab for processing and we will call the patient with lab results when available.  Patient's diagnosis is consistent with viral pharyngitis. Patient will be discharged home with prescriptions for Magic mouthwash with lidocaine and Flonase to take as directed. Patient may take over-the-counter Tylenol or ibuprofen as needed for pain. May continue warm salt water gargles as needed. Patient is to follow up with [facility] if symptoms persist past this treatment course. Patient is given ED precautions to return to the ED for any worsening or new symptoms.      ____________________________________________  FINAL CLINICAL IMPRESSION(S) / ED DIAGNOSES  Final diagnoses:  Viral pharyngitis      NEW MEDICATIONS STARTED DURING THIS VISIT:  New Prescriptions   FLUTICASONE (FLONASE) 50  MCG/ACT NASAL SPRAY    Place 2 sprays into both nostrils daily.   MAGIC MOUTHWASH W/LIDOCAINE SOLN    Take 5 mLs by mouth 4 (four) times daily as needed for mouth pain.         Tracey Pigeon, PA-C 06/22/15 2020  Tracey Blazer, MD 06/22/15 [phone]

## 2015-06-22 NOTE — ED Notes (Signed)
E-signature unavailable. Pt. Verbalized understanding of d/c instructions and denied concerns. Pt. Advised to return or call with any questions.

## 2015-06-22 NOTE — ED Notes (Signed)
C/O sore throat x 2 days.  Symptoms worsening.

## 2015-06-22 NOTE — ED Notes (Signed)
Pt. Verbalizes understanding of d/c instructions, follow-up, and prescriptions. VS stable. Pt. Ambulatory out of the unit with steady gait in NAD at time of d/c and denies concerns.

## 2015-06-22 NOTE — ED Notes (Signed)
Pt. Reports prone to tonsillitis. Pt. Gargled salt water at home, denies relief. Pt. Denies other tx, denies other s/s. Tonsils do not appear to be red and inflamed, minimal swelling noted toLt side.

## 2015-06-22 NOTE — Discharge Instructions (Signed)
Sore Throat °A sore throat is a painful, burning, sore, or scratchy feeling of the throat. There may be pain or tenderness when swallowing or talking. You may have other symptoms with a sore throat. These include coughing, sneezing, fever, or a swollen neck. A sore throat is often the first sign of another sickness. These sicknesses may include a cold, flu, strep throat, or an infection called mono. Most sore throats go away without medical treatment.  °HOME CARE  °· Only take medicine as told by your doctor. °· Drink enough fluids to keep your pee (urine) clear or pale yellow. °· Rest as needed. °· Try using throat sprays, lozenges, or suck on hard candy (if older than 4 years or as told). °· Sip warm liquids, such as broth, herbal tea, or warm water with honey. Try sucking on frozen ice pops or drinking cold liquids. °· Rinse the mouth (gargle) with salt water. Mix 1 teaspoon salt with 8 ounces of water. °· Do not smoke. Avoid being around others when they are smoking. °· Put a humidifier in your bedroom at night to moisten the air. You can also turn on a hot shower and sit in the bathroom for 5-10 minutes. Be sure the bathroom door is closed. °GET HELP RIGHT AWAY IF:  °· You have trouble breathing. °· You cannot swallow fluids, soft foods, or your spit (saliva). °· You have more puffiness (swelling) in the throat. °· Your sore throat does not get better in 7 days. °· You feel sick to your stomach (nauseous) and throw up (vomit). °· You have a fever or lasting symptoms for more than 2-3 days. °· You have a fever and your symptoms suddenly get worse. °MAKE SURE YOU:  °· Understand these instructions. °· Will watch your condition. °· Will get help right away if you are not doing well or get worse. °  °This information is not intended to replace advice given to you by your health care provider. Make sure you discuss any questions you have with your health care provider. °  °Document Released: 10/10/2007 Document  Revised: 09/25/2011 Document Reviewed: 09/08/2011 °Elsevier Interactive Patient Education ©2016 [employer] ° °Pharyngitis °Pharyngitis is a sore throat (pharynx). There is redness, pain, and swelling of your throat. °HOME CARE  °· Drink enough fluids to keep your pee (urine) clear or pale yellow. °· Only take medicine as told by your doctor. °¨ You may get sick again if you do not take medicine as told. Finish your medicines, even if you start to feel better. °¨ Do not take aspirin. °· Rest. °· Rinse your mouth (gargle) with salt water (½ tsp of salt per 1 qt of water) every 1-2 hours. This will help the pain. °· If you are not at risk for choking, you can suck on hard candy or sore throat lozenges. °GET HELP IF: °· You have large, tender lumps on your neck. °· You have a rash. °· You cough up green, yellow-brown, or bloody spit. °GET HELP RIGHT AWAY IF:  °· You have a stiff neck. °· You drool or cannot swallow liquids. °· You throw up (vomit) or are not able to keep medicine or liquids down. °· You have very bad pain that does not go away with medicine. °· You have problems breathing (not from a stuffy nose). °MAKE SURE YOU:  °· Understand these instructions. °· Will watch your condition. °· Will get help right away if you are not doing well or get worse. °  °  This information is not intended to replace advice given to you by your health care provider. Make sure you discuss any questions you have with your health care provider. °  °Document Released: 06/19/2007 Document Revised: 10/21/2012 Document Reviewed: 09/07/2012 °Elsevier Interactive Patient Education ©2016 [employer] ° °

## 2015-09-06 ENCOUNTER — Encounter: Payer: Self-pay | Admitting: Emergency Medicine

## 2015-09-06 ENCOUNTER — Emergency Department
Admission: EM | Admit: 2015-09-06 | Discharge: 2015-09-06 | Disposition: A | Discharge status: Home / Self Care | Payer: Self-pay | Attending: Emergency Medicine | Admitting: Emergency Medicine

## 2015-09-06 DIAGNOSIS — Z791 Long term (current) use of non-steroidal anti-inflammatories (NSAID): Secondary | ICD-10-CM | POA: Insufficient documentation

## 2015-09-06 DIAGNOSIS — B349 Viral infection, unspecified: Secondary | ICD-10-CM | POA: Insufficient documentation

## 2015-09-06 DIAGNOSIS — R69 Illness, unspecified: Secondary | ICD-10-CM

## 2015-09-06 DIAGNOSIS — J111 Influenza due to unidentified influenza virus with other respiratory manifestations: Secondary | ICD-10-CM | POA: Insufficient documentation

## 2015-09-06 LAB — URINALYSIS COMPLETE WITH MICROSCOPIC (ARMC ONLY)
BILIRUBIN URINE: NEGATIVE
GLUCOSE, UA: NEGATIVE mg/dL
HGB URINE DIPSTICK: NEGATIVE
Ketones, ur: NEGATIVE mg/dL
NITRITE: NEGATIVE
Protein, ur: 30 mg/dL — AB
SPECIFIC GRAVITY, URINE: 1.028 (ref 1.005–1.030)
pH: 5 (ref 5.0–8.0)

## 2015-09-06 LAB — CBC
HCT: 37 % (ref 35.0–47.0)
Hemoglobin: 12.8 g/dL (ref 12.0–16.0)
MCH: 27.1 pg (ref 26.0–34.0)
MCHC: 34.7 g/dL (ref 32.0–36.0)
MCV: 78.2 fL — ABNORMAL LOW (ref 80.0–100.0)
PLATELETS: 243 10*3/uL (ref 150–440)
RBC: 4.73 MIL/uL (ref 3.80–5.20)
RDW: 13.6 % (ref 11.5–14.5)
WBC: 16.6 10*3/uL — ABNORMAL HIGH (ref 3.6–11.0)

## 2015-09-06 LAB — BASIC METABOLIC PANEL
ANION GAP: 8 (ref 5–15)
BUN: 8 mg/dL (ref 6–20)
CALCIUM: 8.9 mg/dL (ref 8.9–10.3)
CO2: 22 mmol/L (ref 22–32)
CREATININE: 0.71 mg/dL (ref 0.44–1.00)
Chloride: 105 mmol/L (ref 101–111)
GFR calc Af Amer: >60 mL/min (ref >60)
GLUCOSE: 104 mg/dL — AB (ref 65–99)
Potassium: 3.6 mmol/L (ref 3.5–5.1)
Sodium: 135 mmol/L (ref 135–145)

## 2015-09-06 MED ORDER — ONDANSETRON 4 MG PO TBDP
8.0000 mg | ORAL_TABLET | Freq: Once | ORAL | Status: AC
  Administered 2015-09-06: 8 mg via ORAL
  Filled 2015-09-06: qty 2

## 2015-09-06 MED ORDER — ONDANSETRON 4 MG PO TBDP: 4.0000 mg | ORAL_TABLET | Freq: Three times a day (TID) | ORAL | 0 refills | Status: DC | PRN

## 2015-09-06 MED ORDER — NAPROXEN 500 MG PO TABS: 500.0000 mg | ORAL_TABLET | Freq: Two times a day (BID) | ORAL | 0 refills | Status: DC

## 2015-09-06 MED ORDER — KETOROLAC TROMETHAMINE 60 MG/2ML IM SOLN
15.0000 mg | Freq: Once | INTRAMUSCULAR | Status: AC
  Administered 2015-09-06: 15 mg via INTRAMUSCULAR
  Filled 2015-09-06: qty 2

## 2015-09-06 MED ORDER — FAMOTIDINE 20 MG PO TABS: 20.0000 mg | ORAL_TABLET | Freq: Two times a day (BID) | ORAL | 0 refills | Status: DC

## 2015-09-06 MED ORDER — DEXAMETHASONE SODIUM PHOSPHATE 10 MG/ML IJ SOLN
10.0000 mg | Freq: Once | INTRAMUSCULAR | Status: AC
  Administered 2015-09-06: 10 mg via INTRAMUSCULAR
  Filled 2015-09-06: qty 1

## 2015-09-06 NOTE — ED Provider Notes (Signed)
[facility] Emergency Department Provider Note  ____________________________________________  Time seen: Approximately 6:12 PM  I have reviewed the triage vital signs and the nursing notes.   HISTORY  Chief Complaint Migraine and Near Syncope    HPI Tracey Christensen is a 25 y.o. female who complains of malaise fatigue diffuse myalgias and generalized headache for the past 2 days. She also has nausea and decreased oral intake but no chest pain shortness of breath coughing vomiting or diarrhea. She also has a sore throat. No neck pain or stiffness. Denies any medical history. No trauma.     History reviewed. No pertinent past medical history.   There are no active problems to display for this patient.    History reviewed. No pertinent surgical history.   Prior to Admission medications   Medication Sig Start Date End Date Taking? Authorizing Provider  ciprofloxacin (CIPRO) 500 MG tablet Take 1 tablet (500 mg total) by mouth 2 (two) times daily. 12/09/14   Jene Everyobert Kinner, MD  cyclobenzaprine (FLEXERIL) 10 MG tablet Take 1 tablet (10 mg total) by mouth every 8 (eight) hours as needed for muscle spasms. 01/25/15   Joni Reiningonald K Smith, PA-C  famotidine (PEPCID) 20 MG tablet Take 1 tablet (20 mg total) by mouth 2 (two) times daily. 09/06/15   Sharman CheekPhillip Corbin Falck, MD  fluticasone [facility](FLONASE) 50 MCG/ACT nasal spray Place 2 sprays into both nostrils daily. 06/22/15   Jami L Hagler, PA-C  ibuprofen (ADVIL,MOTRIN) 800 MG tablet Take 1 tablet (800 mg total) by mouth every 8 (eight) hours as needed. 01/25/15   Joni Reiningonald K Smith, PA-C  magic mouthwash w/lidocaine SOLN Take 5 mLs by mouth 4 (four) times daily as needed for mouth pain. 06/22/15   Jami L Hagler, PA-C  naproxen (NAPROSYN) 500 MG tablet Take 1 tablet (500 mg total) by mouth 2 (two) times daily with a meal. 09/06/15   Sharman CheekPhillip Anitha Kreiser, MD  ondansetron (ZOFRAN ODT) 4 MG disintegrating tablet Take 1 tablet (4 mg total) by mouth every  8 (eight) hours as needed for nausea or vomiting. 09/06/15   Sharman CheekPhillip Airel Magadan, MD  traMADol (ULTRAM) 50 MG tablet Take 1 tablet (50 mg total) by mouth every 6 (six) hours as needed for moderate pain. 01/25/15   Joni Reiningonald K Smith, PA-C     Allergies Review of patient's allergies indicates no known allergies.   History reviewed. No pertinent family history.  Social History Social History  Substance Use Topics  . Smoking status: Never Smoker  . Smokeless tobacco: Not on file  . Alcohol use No    Review of Systems  Constitutional:   Subjective fever.  ENT:   Positive sore throat. No rhinorrhea. Cardiovascular:   No chest pain. Respiratory:   No dyspnea or cough. Gastrointestinal:   Negative for abdominal pain, vomiting and diarrhea.  Genitourinary:   Negative for dysuria or difficulty urinating. Musculoskeletal:   Positive diffuse myalgia Neurological:   Positive generalized headache 10-point ROS otherwise negative.  ____________________________________________   PHYSICAL EXAM:  VITAL SIGNS: ED Triage Vitals [09/06/15 1612]  Enc Vitals Group     BP 106/70     Pulse Rate (!) 104     Resp 18     Temp (!) 100.4 F (38 C)     Temp Source Oral     SpO2 98 %     Weight 180 lb (81.6 kg)     Height 5\' 5"  (1.651 m)     Head Circumference  Peak Flow      Pain Score 6     Pain Loc      Pain Edu?      Excl. in GC?     Vital signs reviewed, nursing assessments reviewed.   Constitutional:   Alert and oriented. Well appearing and in no distress. Eyes:   No scleral icterus. No conjunctival pallor. PERRL. EOMI.  No nystagmus. ENT   Head:   Normocephalic and atraumatic.   Nose:   No congestion/rhinnorhea. No septal hematoma   Mouth/Throat:   MMM, no pharyngeal erythema. No peritonsillar mass.    Neck:   No stridor. No SubQ emphysema. No meningismus.Negative jolt accentuation. Able to flex and extend the neck to extremes of range of motion without  pain Hematological/Lymphatic/Immunilogical:   No cervical lymphadenopathy. Cardiovascular:   RRR. Symmetric bilateral radial and DP pulses.  No murmurs.  Respiratory:   Normal respiratory effort without tachypnea nor retractions. Breath sounds are clear and equal bilaterally. No wheezes/rales/rhonchi. Gastrointestinal:   Soft and nontender. Non distended. There is no CVA tenderness.  No rebound, rigidity, or guarding. Genitourinary:   deferred Musculoskeletal:   Nontender with normal range of motion in all extremities. No joint effusions.  No lower extremity tenderness.  No edema. Neurologic:   Normal speech and language.  CN 2-10 normal. Motor grossly intact. No gross focal neurologic deficits are appreciated.  Skin:    Skin is warm, dry and intact. No rash noted.  No petechiae, purpura, or bullae.  ____________________________________________    LABS (pertinent positives/negatives) (all labs ordered are listed, but only abnormal results are displayed) Labs Reviewed  BASIC METABOLIC PANEL - Abnormal; Notable for the following:       Result Value   Glucose, Bld 104 (*)    All other components within normal limits  CBC - Abnormal; Notable for the following:    WBC 16.6 (*)    MCV 78.2 (*)    All other components within normal limits  URINALYSIS COMPLETEWITH MICROSCOPIC ([facility] ONLY) - Abnormal; Notable for the following:    Color, Urine YELLOW (*)    APPearance CLOUDY (*)    Protein, ur 30 (*)    Leukocytes, UA 2+ (*)    Bacteria, UA RARE (*)    Squamous Epithelial / LPF 6-30 (*)    All other components within normal limits  POC URINE PREG, ED   ____________________________________________   EKG  Interpreted by me Normal sinus rhythm rate of 95, normal axis and intervals. Normal QRS ST segments and T waves.  ____________________________________________     RADIOLOGY    ____________________________________________   PROCEDURES Procedures  ____________________________________________   INITIAL IMPRESSION / ASSESSMENT AND PLAN / ED COURSE  Pertinent labs & imaging results that were available during my care of the patient were reviewed by me and considered in my medical decision making (see chart for details).  Vision well-appearing no acute distress. Presents with constellation of symptoms consistent with influenza-like illness. Absolutely no meningismus or other concerns for meningitis or encephalitis. No soft tissue infection or other evidence of any identifiable bacterial illness. We'll treat the patient symptomatically. The leukocytosis is consistent with the inflammatory response and viral syndrome. Follow up with primary care.     Clinical Course   ____________________________________________   FINAL CLINICAL IMPRESSION(S) / ED DIAGNOSES  Final diagnoses:  Influenza-like illness  Acute viral syndrome       Portions of this note were generated with dragon dictation software. Dictation errors may occur despite  best attempts at proofreading.    Sharman CheekPhillip Zi Newbury, MD 09/06/15 1850

## 2015-09-06 NOTE — ED Triage Notes (Signed)
Pt reports started early this AM with generalized body aches, fever, and feeling like going to pass out. Has also had headache.

## 2015-09-07 LAB — POCT PREGNANCY, URINE: Preg Test, Ur: NEGATIVE

## 2015-09-08 ENCOUNTER — Emergency Department
Admission: EM | Admit: 2015-09-08 | Discharge: 2015-09-08 | Disposition: A | Discharge status: Home / Self Care | Payer: Medicaid Other | Attending: Emergency Medicine | Admitting: Emergency Medicine

## 2015-09-08 ENCOUNTER — Encounter: Payer: Self-pay | Admitting: Emergency Medicine

## 2015-09-08 DIAGNOSIS — Z79899 Other long term (current) drug therapy: Secondary | ICD-10-CM | POA: Diagnosis not present

## 2015-09-08 DIAGNOSIS — Z792 Long term (current) use of antibiotics: Secondary | ICD-10-CM | POA: Insufficient documentation

## 2015-09-08 DIAGNOSIS — Z791 Long term (current) use of non-steroidal anti-inflammatories (NSAID): Secondary | ICD-10-CM | POA: Insufficient documentation

## 2015-09-08 DIAGNOSIS — J029 Acute pharyngitis, unspecified: Secondary | ICD-10-CM | POA: Diagnosis present

## 2015-09-08 LAB — POCT RAPID STREP A: STREPTOCOCCUS, GROUP A SCREEN (DIRECT): NEGATIVE

## 2015-09-08 NOTE — ED Provider Notes (Signed)
[facility] Emergency Department Provider Note  ____________________________________________  Time seen: Approximately 8:53 AM  I have reviewed the triage vital signs and the nursing notes.   HISTORY  Chief Complaint Sore Throat    HPI Tracey MangesKendra L Christensen is a 25 y.o. female who presents emergency department complaining of sore throat. Patient was seen in this department 2 days prior and diagnosed with a viral illness. Patient states that her sore throat has worsened and she is concerned that she has strep. She states that she was not tested at her previous visit. Patient denies any fevers, difficulty breathing or swallowing, coughing, abdominal pain, nausea or vomiting. Patient does have a prescription for Magic mouthwash which she has been using. Patient states that it helps somewhat but has not resolved symptoms. Patient is also taking Naprosyn. Patient denies any other complaints at this time. He is a congestion, ear pain.   History reviewed. No pertinent past medical history.  There are no active problems to display for this patient.   History reviewed. No pertinent surgical history.  Prior to Admission medications   Medication Sig Start Date End Date Taking? Authorizing Provider  ciprofloxacin (CIPRO) 500 MG tablet Take 1 tablet (500 mg total) by mouth 2 (two) times daily. Patient not taking: Reported on 09/06/2015 12/09/14   Jene Everyobert Kinner, MD  cyclobenzaprine (FLEXERIL) 10 MG tablet Take 1 tablet (10 mg total) by mouth every 8 (eight) hours as needed for muscle spasms. Patient not taking: Reported on 09/06/2015 01/25/15   Joni Reiningonald K Smith, PA-C  famotidine (PEPCID) 20 MG tablet Take 1 tablet (20 mg total) by mouth 2 (two) times daily. 09/06/15   Sharman CheekPhillip Stafford, MD  fluticasone [facility](FLONASE) 50 MCG/ACT nasal spray Place 2 sprays into both nostrils daily. Patient not taking: Reported on 09/06/2015 06/22/15   Jami L Hagler, PA-C  ibuprofen (ADVIL,MOTRIN) 200 MG tablet  Take 400 mg by mouth every 6 (six) hours as needed.    Historical Provider, MD  magic mouthwash w/lidocaine SOLN Take 5 mLs by mouth 4 (four) times daily as needed for mouth pain. Patient not taking: Reported on 09/06/2015 06/22/15   Jami L Hagler, PA-C  naproxen (NAPROSYN) 500 MG tablet Take 1 tablet (500 mg total) by mouth 2 (two) times daily with a meal. 09/06/15   Sharman CheekPhillip Stafford, MD  ondansetron (ZOFRAN ODT) 4 MG disintegrating tablet Take 1 tablet (4 mg total) by mouth every 8 (eight) hours as needed for nausea or vomiting. 09/06/15   Sharman CheekPhillip Stafford, MD  traMADol (ULTRAM) 50 MG tablet Take 1 tablet (50 mg total) by mouth every 6 (six) hours as needed for moderate pain. Patient not taking: Reported on 09/06/2015 01/25/15   Joni Reiningonald K Smith, PA-C    Allergies Review of patient's allergies indicates no known allergies.  No family history on file.  Social History Social History  Substance Use Topics  . Smoking status: Never Smoker  . Smokeless tobacco: Never Used  . Alcohol use No     Review of Systems  Constitutional: No fever/chills Eyes: No visual changes. No discharge ZOX:[id number]:Positive for sore throat. Cardiovascular: no chest pain. Respiratory: no cough. No SOB. Gastrointestinal: No abdominal pain.  No nausea, no vomiting.  Musculoskeletal: Negative for musculoskeletal pain. Skin: Negative for rash, abrasions, lacerations, ecchymosis. Neurological: Negative for headaches, focal weakness or numbness. 10-point ROS otherwise negative.  ____________________________________________   PHYSICAL EXAM:  VITAL SIGNS: ED Triage Vitals  Enc Vitals Group     BP 09/08/15 0824 131/73  Pulse Rate 09/08/15 0824 71     Resp 09/08/15 0824 18     Temp 09/08/15 0824 98.2 F (36.8 C)     Temp Source 09/08/15 0824 Oral     SpO2 09/08/15 0824 99 %     Weight 09/08/15 0824 180 lb (81.6 kg)     Height 09/08/15 0824 5\' 5"  (1.651 m)     Head Circumference --      Peak Flow --      Pain Score  09/08/15 0817 10     Pain Loc --      Pain Edu? --      Excl. in GC? --      Constitutional: Alert and oriented. Well appearing and in no acute distress. Eyes: Conjunctivae are normal. PERRL. EOMI. Head: Atraumatic. ENT:      Ears: EACs and TMs are unremarkable bilaterally.      Nose: Mild clear congestion/rhinnorhea. Turbinates are slightly erythematous.      Mouth/Throat: Mucous membranes are moist. Tonsils are erythematous but nonedematous. No exudates. Uvula is midline. Neck: No stridor. Neck is supple with full range of motion Hematological/Lymphatic/Immunilogical: No cervical lymphadenopathy. Cardiovascular: Normal rate, regular rhythm. Normal S1 and S2.  Good peripheral circulation. Respiratory: Normal respiratory effort without tachypnea or retractions. Lungs CTAB. Good air entry to the bases with no decreased or absent breath sounds. Musculoskeletal: Full range of motion to all extremities. No gross deformities appreciated. Neurologic:  Normal speech and language. No gross focal neurologic deficits are appreciated.  Skin:  Skin is warm, dry and intact. No rash noted. Psychiatric: Mood and affect are normal. Speech and behavior are normal. Patient exhibits appropriate insight and judgement.   ____________________________________________   LABS (all labs ordered are listed, but only abnormal results are displayed)  Labs Reviewed  POCT RAPID STREP A   ____________________________________________  EKG   ____________________________________________  RADIOLOGY   No results found.  ____________________________________________    PROCEDURES  Procedure(s) performed:    Procedures    Medications - No data to display   ____________________________________________   INITIAL IMPRESSION / ASSESSMENT AND PLAN / ED COURSE  Pertinent labs & imaging results that were available during my care of the patient were reviewed by me and considered in my medical  decision making (see chart for details).  Review of the [place] CSRS was performed in accordance of the [facility] prior to dispensing any controlled drugs.  Clinical Course    Patient's diagnosis is consistent with Viral pharyngitis. Patient's strep test was negative in the emergency department. Patient does not meet [facility] criteria for strep throat. Patient is informed of diagnosis. Patient verbalizes understanding same. Patient is encouraged to continue Tylenol, Naprosyn, magic mouthwash for symptom control.. No new medications prescribed. Patient will follow-up with primary care as needed.. Patient is given ED precautions to return to the ED for any worsening or new symptoms.     ____________________________________________  FINAL CLINICAL IMPRESSION(S) / ED DIAGNOSES  Final diagnoses:  Viral pharyngitis      NEW MEDICATIONS STARTED DURING THIS VISIT:  New Prescriptions   No medications on file        This chart was dictated using voice recognition software/Dragon. Despite best efforts to proofread, errors can occur which can change the meaning. Any change was purely unintentional.    Racheal Patches, PA-C 09/08/15 1610    Myrna Blazer, MD 09/08/15 [phone]

## 2015-09-08 NOTE — ED Triage Notes (Signed)
Patient presents to the ED with sore throat, painful to swallow.  Patient reports frequent tonsillitis.  Patient states she was seen in the ED recently and given a mouth wash but patient states throat pain has increased and not improved.  Patient is in no obvious distress at this time.

## 2015-11-02 ENCOUNTER — Emergency Department
Admission: EM | Admit: 2015-11-02 | Discharge: 2015-11-02 | Disposition: A | Discharge status: Home / Self Care | Payer: Medicaid Other | Attending: Emergency Medicine | Admitting: Emergency Medicine

## 2015-11-02 DIAGNOSIS — N760 Acute vaginitis: Secondary | ICD-10-CM | POA: Diagnosis present

## 2015-11-02 DIAGNOSIS — N3000 Acute cystitis without hematuria: Secondary | ICD-10-CM | POA: Insufficient documentation

## 2015-11-02 DIAGNOSIS — Z79899 Other long term (current) drug therapy: Secondary | ICD-10-CM | POA: Diagnosis not present

## 2015-11-02 LAB — URINALYSIS COMPLETE WITH MICROSCOPIC (ARMC ONLY): SPECIFIC GRAVITY, URINE: 1.04 — AB (ref 1.005–1.030)

## 2015-11-02 LAB — POCT PREGNANCY, URINE: PREG TEST UR: NEGATIVE

## 2015-11-02 MED ORDER — SULFAMETHOXAZOLE-TRIMETHOPRIM 800-160 MG PO TABS: 1.0000 | ORAL_TABLET | Freq: Two times a day (BID) | ORAL | 0 refills | Status: DC

## 2015-11-02 MED ORDER — SULFAMETHOXAZOLE-TRIMETHOPRIM 800-160 MG PO TABS
1.0000 | ORAL_TABLET | Freq: Once | ORAL | Status: AC
  Administered 2015-11-02: 1 via ORAL
  Filled 2015-11-02: qty 1

## 2015-11-02 NOTE — ED Provider Notes (Signed)
[facility] Emergency Department Provider Note  ____________________________________________  Time seen: Approximately 9:54 PM  I have reviewed the triage vital signs and the nursing notes.   HISTORY  Chief Complaint Vaginitis    HPI Tracey Christensen is a 25 y.o. female who presents to the emergency department for evaluation of vaginal discomfort. She states that Sunday she noticed a burning with urination, but states that she took some leftover amoxicillin and that took care of the burning but she continued to feel "irritated." She purchased over-the-counter Azo and that has not relieved the irritation either. She denies vaginal discharge. She denies new partners or concern for STD.  No past medical history on file.  There are no active problems to display for this patient.   No past surgical history on file.  Prior to Admission medications   Medication Sig Start Date End Date Taking? Authorizing Provider  ciprofloxacin (CIPRO) 500 MG tablet Take 1 tablet (500 mg total) by mouth 2 (two) times daily. Patient not taking: Reported on 09/06/2015 12/09/14   Jene Every, MD  cyclobenzaprine (FLEXERIL) 10 MG tablet Take 1 tablet (10 mg total) by mouth every 8 (eight) hours as needed for muscle spasms. Patient not taking: Reported on 09/06/2015 01/25/15   Joni Reining, PA-C  famotidine (PEPCID) 20 MG tablet Take 1 tablet (20 mg total) by mouth 2 (two) times daily. 09/06/15   Sharman Cheek, MD  fluticasone [facility]) 50 MCG/ACT nasal spray Place 2 sprays into both nostrils daily. Patient not taking: Reported on 09/06/2015 06/22/15   Jami L Hagler, PA-C  ibuprofen (ADVIL,MOTRIN) 200 MG tablet Take 400 mg by mouth every 6 (six) hours as needed.    Historical Provider, MD  magic mouthwash w/lidocaine SOLN Take 5 mLs by mouth 4 (four) times daily as needed for mouth pain. Patient not taking: Reported on 09/06/2015 06/22/15   Jami L Hagler, PA-C  naproxen (NAPROSYN) 500 MG  tablet Take 1 tablet (500 mg total) by mouth 2 (two) times daily with a meal. 09/06/15   Sharman Cheek, MD  ondansetron (ZOFRAN ODT) 4 MG disintegrating tablet Take 1 tablet (4 mg total) by mouth every 8 (eight) hours as needed for nausea or vomiting. 09/06/15   Sharman Cheek, MD  sulfamethoxazole-trimethoprim (BACTRIM DS,SEPTRA DS) 800-160 MG tablet Take 1 tablet by mouth 2 (two) times daily. 11/02/15   Chinita Pester, FNP  traMADol (ULTRAM) 50 MG tablet Take 1 tablet (50 mg total) by mouth every 6 (six) hours as needed for moderate pain. Patient not taking: Reported on 09/06/2015 01/25/15   Joni Reining, PA-C    Allergies Review of patient's allergies indicates no known allergies.  No family history on file.  Social History Social History  Substance Use Topics  . Smoking status: Never Smoker  . Smokeless tobacco: Never Used  . Alcohol use No    Review of Systems Constitutional: Negative for fever. Respiratory: Negative for shortness of breath or cough. Gastrointestinal: Negative for abdominal pain; negative for nausea , negative for vomiting. Genitourinary: Positive for dysuria , negative for vaginal discharge. Musculoskeletal: Negative for back pain. Skin: Negative for rash, lesion, or wounds. ____________________________________________   PHYSICAL EXAM:  VITAL SIGNS: ED Triage Vitals  Enc Vitals Group     BP 11/02/15 2131 135/77     Pulse Rate 11/02/15 2131 68     Resp 11/02/15 2131 18     Temp 11/02/15 2131 98.6 F (37 C)     Temp Source 11/02/15  2131 Oral     SpO2 11/02/15 2131 100 %     Weight 11/02/15 2126 180 lb (81.6 kg)     Height 11/02/15 2126 5\' 6"  (1.676 m)     Head Circumference --      Peak Flow --      Pain Score 11/02/15 2126 10     Pain Loc --      Pain Edu? --      Excl. in GC? --     Constitutional: Alert and oriented. Well appearing and in no acute distress. Eyes: Conjunctivae are normal. PERRL. EOMI. Head: Atraumatic. Nose: No  congestion/rhinnorhea. Mouth/Throat: Mucous membranes are moist. Respiratory: Normal respiratory effort.  No retractions. Gastrointestinal: Abdomen soft and nontender without rebound or guarding. Genitourinary: Pelvic exam: deferred. Musculoskeletal: No extremity tenderness nor edema.  Neurologic:  Normal speech and language. No gross focal neurologic deficits are appreciated. Speech is normal. No gait instability. Skin:  Skin is warm, dry and intact. No rash noted. Psychiatric: Mood and affect are normal. Speech and behavior are normal.  ____________________________________________   LABS (all labs ordered are listed, but only abnormal results are displayed)  Labs Reviewed  URINALYSIS COMPLETEWITH MICROSCOPIC ([facility] ONLY) - Abnormal; Notable for the following:       Result Value   Color, Urine ORANGE (*)    APPearance CLOUDY (*)    Glucose, UA   (*)    Value: TEST NOT REPORTED DUE TO COLOR INTERFERENCE OF URINE PIGMENT   Bilirubin Urine   (*)    Value: TEST NOT REPORTED DUE TO COLOR INTERFERENCE OF URINE PIGMENT   Ketones, ur   (*)    Value: TEST NOT REPORTED DUE TO COLOR INTERFERENCE OF URINE PIGMENT   Specific Gravity, Urine 1.040 (*)    Hgb urine dipstick   (*)    Value: TEST NOT REPORTED DUE TO COLOR INTERFERENCE OF URINE PIGMENT   Protein, ur   (*)    Value: TEST NOT REPORTED DUE TO COLOR INTERFERENCE OF URINE PIGMENT   Nitrite   (*)    Value: TEST NOT REPORTED DUE TO COLOR INTERFERENCE OF URINE PIGMENT   Leukocytes, UA   (*)    Value: TEST NOT REPORTED DUE TO COLOR INTERFERENCE OF URINE PIGMENT   Bacteria, UA RARE (*)    Squamous Epithelial / LPF 6-30 (*)    All other components within normal limits  POCT PREGNANCY, URINE   ____________________________________________  RADIOLOGY  Not indicated. ____________________________________________   PROCEDURES  Procedure(s) performed: None  ____________________________________________   INITIAL IMPRESSION /  ASSESSMENT AND PLAN / ED COURSE  Clinical Course     Pertinent labs & imaging results that were available during my care of the patient were reviewed by me and considered in my medical decision making (see chart for details).   Patient will be given prescriptions for Bactrim today. [facility] symptoms that are not improving over the next 2-3 days. She was advised to return to the ER for symptoms that change or worsen if unable to schedule an appointment. ____________________________________________   FINAL CLINICAL IMPRESSION(S) / ED DIAGNOSES  Final diagnoses:  Acute cystitis without hematuria    Note:  This document was prepared using Dragon voice recognition software and may include unintentional dictation errors.    Chinita PesterCari B Baltasar Twilley, FNP 11/02/15 2251    Rockne MenghiniAnne-Caroline Norman, MD 11/03/15 0001

## 2015-11-02 NOTE — ED Triage Notes (Signed)
Patient ambulatory to triage with steady gait, without difficulty or distress noted; pt reports vaginal discomfort; denies urinary c/o, denies vag discharge; st possibly from perfumed soap

## 2015-11-02 NOTE — ED Notes (Signed)
Flex nurse reports PA states unable to see this pt in flex before closing; pt transferred to major wait

## 2015-11-02 NOTE — ED Notes (Signed)

## 2016-11-21 ENCOUNTER — Encounter: Payer: Self-pay | Admitting: Emergency Medicine

## 2016-11-21 ENCOUNTER — Emergency Department
Admission: EM | Admit: 2016-11-21 | Discharge: 2016-11-21 | Disposition: A | Discharge status: Home / Self Care | Payer: Self-pay | Attending: Student in an Organized Health Care Education/Training Program | Admitting: Student in an Organized Health Care Education/Training Program

## 2016-11-21 ENCOUNTER — Other Ambulatory Visit: Payer: Self-pay

## 2016-11-21 DIAGNOSIS — N3090 Cystitis, unspecified without hematuria: Secondary | ICD-10-CM | POA: Insufficient documentation

## 2016-11-21 DIAGNOSIS — Z79899 Other long term (current) drug therapy: Secondary | ICD-10-CM | POA: Insufficient documentation

## 2016-11-21 DIAGNOSIS — R300 Dysuria: Secondary | ICD-10-CM | POA: Insufficient documentation

## 2016-11-21 DIAGNOSIS — Z791 Long term (current) use of non-steroidal anti-inflammatories (NSAID): Secondary | ICD-10-CM | POA: Insufficient documentation

## 2016-11-21 LAB — URINALYSIS, COMPLETE (UACMP) WITH MICROSCOPIC
BILIRUBIN URINE: NEGATIVE
Glucose, UA: NEGATIVE mg/dL
KETONES UR: NEGATIVE mg/dL
NITRITE: NEGATIVE
Protein, ur: 30 mg/dL — AB
SPECIFIC GRAVITY, URINE: 1.019 (ref 1.005–1.030)
pH: 5 (ref 5.0–8.0)

## 2016-11-21 LAB — POCT PREGNANCY, URINE: PREG TEST UR: NEGATIVE

## 2016-11-21 MED ORDER — CEPHALEXIN 500 MG PO CAPS: 500.0000 mg | ORAL_CAPSULE | Freq: Two times a day (BID) | ORAL | 0 refills | Status: AC

## 2016-11-21 NOTE — ED Provider Notes (Signed)
[facility] Emergency Department Provider Note   ____________________________________________   I have reviewed the triage vital signs and the nursing notes.   HISTORY  Chief Complaint Urinary Tract Infection    HPI Tracey Christensen is a 26 y.o. female presents to the emergency department with dysuria, increased frequency and lower pelvic pain for the past 2 days.  Patient reports a remote history of urinary tract infection however denies any history of kidney infection or kidney stones.  She denies nausea, vomiting, fever or headache associated with above symptoms.  Blood in her urine.  Patient has tried over-the-counter remedies and has been hydrating to alleviate symptoms without improvement.  Patient denies back or flank pain. vision changes, chest pain, chest tightness, shortness of breath or abdominal pain.  History reviewed. No pertinent past medical history.  There are no active problems to display for this patient.   Past Surgical History:  Procedure Laterality Date  . HAND SURGERY Right     Prior to Admission medications   Medication Sig Start Date End Date Taking? Authorizing Provider  cephALEXin (KEFLEX) 500 MG capsule Take 1 capsule (500 mg total) 2 (two) times daily for 7 days by mouth. 11/21/16 11/28/16  Imaya Duffy M, PA-C  ciprofloxacin (CIPRO) 500 MG tablet Take 1 tablet (500 mg total) by mouth 2 (two) times daily. Patient not taking: Reported on 09/06/2015 12/09/14   Jene EveryKinner, Robert, MD  cyclobenzaprine (FLEXERIL) 10 MG tablet Take 1 tablet (10 mg total) by mouth every 8 (eight) hours as needed for muscle spasms. Patient not taking: Reported on 09/06/2015 01/25/15   Joni ReiningSmith, Ronald K, PA-C  famotidine (PEPCID) 20 MG tablet Take 1 tablet (20 mg total) by mouth 2 (two) times daily. 09/06/15   Sharman CheekStafford, Phillip, MD  fluticasone [facility](FLONASE) 50 MCG/ACT nasal spray Place 2 sprays into both nostrils daily. Patient not taking: Reported on 09/06/2015  06/22/15   Hagler, Jami L, PA-C  ibuprofen (ADVIL,MOTRIN) 200 MG tablet Take 400 mg by mouth every 6 (six) hours as needed.    [provider]  magic mouthwash w/lidocaine SOLN Take 5 mLs by mouth 4 (four) times daily as needed for mouth pain. Patient not taking: Reported on 09/06/2015 06/22/15   Hagler, Jami L, PA-C  naproxen (NAPROSYN) 500 MG tablet Take 1 tablet (500 mg total) by mouth 2 (two) times daily with a meal. 09/06/15   Sharman CheekStafford, Phillip, MD  ondansetron (ZOFRAN ODT) 4 MG disintegrating tablet Take 1 tablet (4 mg total) by mouth every 8 (eight) hours as needed for nausea or vomiting. 09/06/15   Sharman CheekStafford, Phillip, MD  sulfamethoxazole-trimethoprim (BACTRIM DS,SEPTRA DS) 800-160 MG tablet Take 1 tablet by mouth 2 (two) times daily. 11/02/15   Triplett, Rulon Eisenmengerari B, FNP  traMADol (ULTRAM) 50 MG tablet Take 1 tablet (50 mg total) by mouth every 6 (six) hours as needed for moderate pain. Patient not taking: Reported on 09/06/2015 01/25/15   Joni ReiningSmith, Ronald K, PA-C    Allergies Patient has no known allergies.  History reviewed. No pertinent family history.  Social History Social History   Tobacco Use  . Smoking status: Never Smoker  . Smokeless tobacco: Never Used  Substance Use Topics  . Alcohol use: No  . Drug use: No    Review of Systems Constitutional: Negative for fever/chills Cardiovascular: Denies chest pain. Respiratory: Denies shortness of breath. Gastrointestinal: No abdominal pain.  No nausea, vomiting, diarrhea. Denies flank pain. Genitourinary: Negative for dysuria and increased urgency Musculoskeletal: Negative for back  pain. Skin: Negative for rash. Neurological: Negative for headaches.  ____________________________________________   PHYSICAL EXAM:  VITAL SIGNS: ED Triage Vitals  Enc Vitals Group     BP 11/21/16 1845 (!) 141/98     Pulse Rate 11/21/16 1845 76     Resp 11/21/16 1845 18     Temp 11/21/16 1845 98.9 F (37.2 C)     Temp Source 11/21/16 1845  Oral     SpO2 11/21/16 1845 100 %     Weight 11/21/16 1846 230 lb (104.3 kg)     Height 11/21/16 1846 5\' 6"  (1.676 m)     Head Circumference --      Peak Flow --      Pain Score 11/21/16 1847 4     Pain Loc --      Pain Edu? --      Excl. in GC? --     Constitutional: Alert and oriented. Well appearing and in no acute distress.  Eyes: Conjunctivae are normal. PERRL.  Head: Normocephalic and atraumatic. Cardiovascular: Normal rate, regular rhythm. Good peripheral circulation. Respiratory: Normal respiratory effort without tachypnea or retractions. Gastrointestinal: Bowel sounds 4 quadrants. Soft and nontender to palpation. No CVA tenderness. Genitourinary: Negative for dysuria and increased urgency.  No urinary or vaginal drainage.  Musculoskeletal: Nontender with normal range of motion in all extremities. Neurologic: Normal speech and language.  Skin:  Skin is warm, dry and intact. No rash noted. Psychiatric: Mood and affect are normal. Speech and behavior are normal. Patient exhibits appropriate insight and judgement.  ____________________________________________   LABS (all labs ordered are listed, but only abnormal results are displayed)  Labs Reviewed  URINALYSIS, COMPLETE (UACMP) WITH MICROSCOPIC - Abnormal; Notable for the following components:      Result Value   Color, Urine YELLOW (*)    APPearance CLOUDY (*)    Hgb urine dipstick MODERATE (*)    Protein, ur 30 (*)    Leukocytes, UA LARGE (*)    Bacteria, UA MANY (*)    Squamous Epithelial / LPF 0-5 (*)    All other components within normal limits  POC URINE PREG, ED  POCT PREGNANCY, URINE   ____________________________________________  EKG none ____________________________________________  RADIOLOGY none ____________________________________________   PROCEDURES  Procedure(s) performed: no   Critical Care performed: no ____________________________________________   INITIAL IMPRESSION /  ASSESSMENT AND PLAN / ED COURSE  Pertinent labs & imaging results that were available during my care of the patient were reviewed by me and considered in my medical decision making (see chart for details).  Patient presented to the emergency department with dysuria, increased urinary frequency for 2 days. History, physical exam labs and urinalysis are reassuring symptoms consistent with acute cystitis. Patient will be given cephalexin for antibiotic coverage and encouraged to hydrate. Patient informed of clinical course, understand medical decision-making process, and agree with plan. Patient was advised to follow up with PCP as needed and was also advised to return to the emergency department for symptoms that change or worsen.      ____________________________________________   FINAL CLINICAL IMPRESSION(S) / ED DIAGNOSES  Final diagnoses:  Cystitis  Dysuria       NEW MEDICATIONS STARTED DURING THIS VISIT:  This SmartLink is deprecated. Use AVSMEDLIST instead to display the medication list for a patient.   Note:  This document was prepared using Dragon voice recognition software and may include unintentional dictation errors.    Clois ComberLittle, Maddy Graham M, PA-C 11/21/16 1950    Willy Eddyobinson, Patrick,  MD 11/21/16 2321

## 2016-11-21 NOTE — ED Triage Notes (Signed)
Pt to ED from home c/o urinary pain, LLQ pain, pressure after voiding x2 days.  Denies blood or discharge in urine, denies fevers.

## 2016-11-21 NOTE — Discharge Instructions (Signed)
Encourage hydration.   Take medication as prescribed.   Return to emergency department if symptoms worsen and follow-up with PCP as needed.

## 2017-02-24 LAB — HM PAP SMEAR: HM Pap smear: NEGATIVE

## 2017-03-12 ENCOUNTER — Emergency Department
Admission: EM | Admit: 2017-03-12 | Discharge: 2017-03-12 | Disposition: A | Discharge status: Home / Self Care | Payer: No Typology Code available for payment source | Attending: Emergency Medicine | Admitting: Emergency Medicine

## 2017-03-12 ENCOUNTER — Encounter: Payer: Self-pay | Admitting: Emergency Medicine

## 2017-03-12 DIAGNOSIS — S199XXA Unspecified injury of neck, initial encounter: Secondary | ICD-10-CM | POA: Diagnosis present

## 2017-03-12 DIAGNOSIS — Y939 Activity, unspecified: Secondary | ICD-10-CM | POA: Diagnosis not present

## 2017-03-12 DIAGNOSIS — Y999 Unspecified external cause status: Secondary | ICD-10-CM | POA: Diagnosis not present

## 2017-03-12 DIAGNOSIS — S161XXA Strain of muscle, fascia and tendon at neck level, initial encounter: Secondary | ICD-10-CM | POA: Insufficient documentation

## 2017-03-12 DIAGNOSIS — Y929 Unspecified place or not applicable: Secondary | ICD-10-CM | POA: Diagnosis not present

## 2017-03-12 DIAGNOSIS — Z79899 Other long term (current) drug therapy: Secondary | ICD-10-CM | POA: Insufficient documentation

## 2017-03-12 MED ORDER — METHOCARBAMOL 500 MG PO TABS: 500.0000 mg | ORAL_TABLET | Freq: Four times a day (QID) | ORAL | 0 refills | Status: DC

## 2017-03-12 MED ORDER — METHOCARBAMOL 500 MG PO TABS
1000.0000 mg | ORAL_TABLET | Freq: Once | ORAL | Status: AC
  Administered 2017-03-12: 1000 mg via ORAL
  Filled 2017-03-12: qty 2

## 2017-03-12 MED ORDER — MELOXICAM 7.5 MG PO TABS
15.0000 mg | ORAL_TABLET | Freq: Once | ORAL | Status: AC
  Administered 2017-03-12: 15 mg via ORAL
  Filled 2017-03-12: qty 2

## 2017-03-12 MED ORDER — MELOXICAM 15 MG PO TABS: 15.0000 mg | ORAL_TABLET | Freq: Every day | ORAL | 0 refills | Status: DC
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
## 2017-03-16 ENCOUNTER — Emergency Department
Admission: EM | Admit: 2017-03-16 | Discharge: 2017-03-16 | Disposition: A | Discharge status: Home / Self Care | Payer: No Typology Code available for payment source | Attending: Emergency Medicine | Admitting: Emergency Medicine

## 2017-03-16 ENCOUNTER — Encounter: Payer: Self-pay | Admitting: Emergency Medicine

## 2017-03-16 DIAGNOSIS — Y999 Unspecified external cause status: Secondary | ICD-10-CM | POA: Insufficient documentation

## 2017-03-16 DIAGNOSIS — S39012A Strain of muscle, fascia and tendon of lower back, initial encounter: Secondary | ICD-10-CM | POA: Diagnosis not present

## 2017-03-16 DIAGNOSIS — Y92481 Parking lot as the place of occurrence of the external cause: Secondary | ICD-10-CM | POA: Diagnosis not present

## 2017-03-16 DIAGNOSIS — S3992XA Unspecified injury of lower back, initial encounter: Secondary | ICD-10-CM | POA: Diagnosis present

## 2017-03-16 DIAGNOSIS — Z79899 Other long term (current) drug therapy: Secondary | ICD-10-CM | POA: Diagnosis not present

## 2017-03-16 DIAGNOSIS — Y9389 Activity, other specified: Secondary | ICD-10-CM | POA: Diagnosis not present

## 2017-03-16 MED ORDER — MELOXICAM 15 MG PO TABS: 15.0000 mg | ORAL_TABLET | Freq: Every day | ORAL | 0 refills | Status: DC

## 2017-03-16 MED ORDER — METHOCARBAMOL 500 MG PO TABS: 500.0000 mg | ORAL_TABLET | Freq: Four times a day (QID) | ORAL | 0 refills | Status: DC

## 2017-03-16 NOTE — Discharge Instructions (Signed)
Please take medications as prescribed, follow-up with orthopedics if no improvement in 1 week.  Return to the emergency department for any worsening symptoms or any changes in health.
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
## 2017-04-30 ENCOUNTER — Encounter: Payer: Self-pay | Admitting: Emergency Medicine

## 2017-04-30 ENCOUNTER — Other Ambulatory Visit: Payer: Self-pay

## 2017-04-30 ENCOUNTER — Emergency Department
Admission: EM | Admit: 2017-04-30 | Discharge: 2017-04-30 | Disposition: A | Discharge status: Home / Self Care | Payer: Medicaid Other | Attending: Emergency Medicine | Admitting: Emergency Medicine

## 2017-04-30 DIAGNOSIS — Z3201 Encounter for pregnancy test, result positive: Secondary | ICD-10-CM | POA: Diagnosis not present

## 2017-04-30 DIAGNOSIS — Z3A01 Less than 8 weeks gestation of pregnancy: Secondary | ICD-10-CM | POA: Diagnosis not present

## 2017-04-30 DIAGNOSIS — Z79899 Other long term (current) drug therapy: Secondary | ICD-10-CM | POA: Insufficient documentation

## 2017-04-30 LAB — POCT PREGNANCY, URINE: PREG TEST UR: POSITIVE — AB

## 2017-04-30 NOTE — ED Triage Notes (Signed)
G3P2.  Here to have urine confirmation of pregnancy. No pain or bleeding just wants something saying pregnant.  Plans to use [facility] but has not called yet.

## 2017-04-30 NOTE — ED Notes (Signed)
See triage note  States she just needs a confirmation of preg  Denies any abd pain or vaginal discharge or bleeding

## 2017-04-30 NOTE — Discharge Instructions (Signed)
Please start prenatal vitamins

## 2017-05-01 NOTE — ED Provider Notes (Signed)
[facility] Emergency Department Provider Note  ____________________________________________  Time seen: Approximately 4:34 PM  I have reviewed the triage vital signs and the nursing notes.   HISTORY  Chief Complaint wants pregnancy confirmation   HPI Tracey Christensen is a 27 y.o. female who presents to the emergency department for a pregnancy confirmation so she can get her pregnancy medicaid coverage and schedule with OB GYN. She has no medical complaints. Positive pregnancy test at home.  History reviewed. No pertinent past medical history.  There are no active problems to display for this patient.   Past Surgical History:  Procedure Laterality Date  . HAND SURGERY Right     Prior to Admission medications   Medication Sig Start Date End Date Taking? Authorizing Provider  famotidine (PEPCID) 20 MG tablet Take 1 tablet (20 mg total) by mouth 2 (two) times daily. 09/06/15   Sharman CheekStafford, Phillip, MD  ibuprofen (ADVIL,MOTRIN) 200 MG tablet Take 400 mg by mouth every 6 (six) hours as needed.    [provider]  meloxicam (MOBIC) 15 MG tablet Take 1 tablet (15 mg total) by mouth daily. 03/16/17   Evon SlackGaines, Thomas C, PA-C  methocarbamol (ROBAXIN) 500 MG tablet Take 1 tablet (500 mg total) by mouth 4 (four) times daily. 03/16/17   Evon SlackGaines, Thomas C, PA-C    Allergies Patient has no known allergies.  History reviewed. No pertinent family history.  Social History Social History   Tobacco Use  . Smoking status: Never Smoker  . Smokeless tobacco: Never Used  Substance Use Topics  . Alcohol use: No  . Drug use: No    Review of Systems Constitutional: Negative for fever. ENT: Negative for sore throat. Respiratory: Negative Gastrointestinal: No abdominal pain.  No nausea, no vomiting.  No diarrhea.  Musculoskeletal: Negative for generalized body aches. Skin: Negative for rash/lesion/wound. Neurological: Negative for headaches, focal weakness or  numbness.  ____________________________________________   PHYSICAL EXAM:  VITAL SIGNS: ED Triage Vitals  Enc Vitals Group     BP 04/30/17 0945 137/85     Pulse Rate 04/30/17 0943 80     Resp 04/30/17 0943 18     Temp 04/30/17 0943 99.4 F (37.4 C)     Temp Source 04/30/17 0943 Oral     SpO2 04/30/17 0943 99 %     Weight 04/30/17 0944 210 lb (95.3 kg)     Height 04/30/17 0944 5\' 6"  (1.676 m)     Head Circumference --      Peak Flow --      Pain Score 04/30/17 0945 0     Pain Loc --      Pain Edu? --      Excl. in GC? --     Constitutional: Alert and oriented. Well appearing and in no acute distress. Eyes: Conjunctivae are normal. PERRL. EOMI. Head: Atraumatic. Nose: No congestion/rhinnorhea. Mouth/Throat: Mucous membranes are moist. Neck: No stridor.  Cardiovascular: Normal rate, regular rhythm. Good peripheral circulation. Respiratory: Normal respiratory effort. Musculoskeletal: Full ROM throughout.  Neurologic:  Normal speech and language. No gross focal neurologic deficits are appreciated. Speech is normal. No gait instability. Skin:  Skin is warm, dry and intact. No rash noted. Psychiatric: Mood and affect are normal. Speech and behavior are normal.  ____________________________________________   LABS (all labs ordered are listed, but only abnormal results are displayed)  Labs Reviewed  POCT PREGNANCY, URINE - Abnormal; Notable for the following components:      Result Value  Preg Test, Ur POSITIVE (*)    All other components within normal limits   ____________________________________________  EKG  Not indicated. ____________________________________________  RADIOLOGY  Not indicated. ____________________________________________   PROCEDURES  None ____________________________________________   INITIAL IMPRESSION / ASSESSMENT AND PLAN / ED COURSE     Pertinent labs & imaging results that were available during my care of the patient were  reviewed by me and considered in my medical decision making (see chart for details).  Patient was advised to begin taking prenatal vitamins and schedule an appointment with OB GYN. ____________________________________________   FINAL CLINICAL IMPRESSION(S) / ED DIAGNOSES  Final diagnoses:  Less than [redacted] weeks gestation of pregnancy       Chinita Pester, FNP 05/01/17 1637    Darci Current, MD 05/02/17 [phone]

## 2017-05-13 ENCOUNTER — Encounter: Payer: Self-pay | Admitting: Maternal Newborn

## 2017-05-28 DIAGNOSIS — E669 Obesity, unspecified: Secondary | ICD-10-CM | POA: Insufficient documentation

## 2017-05-28 DIAGNOSIS — O09299 Supervision of pregnancy with other poor reproductive or obstetric history, unspecified trimester: Secondary | ICD-10-CM | POA: Insufficient documentation

## 2017-11-03 ENCOUNTER — Emergency Department
Admission: EM | Admit: 2017-11-03 | Discharge: 2017-11-03 | Disposition: A | Discharge status: Home / Self Care | Payer: Medicaid Other | Attending: Emergency Medicine | Admitting: Emergency Medicine

## 2017-11-03 ENCOUNTER — Encounter: Payer: Self-pay | Admitting: Emergency Medicine

## 2017-11-03 DIAGNOSIS — O9989 Other specified diseases and conditions complicating pregnancy, childbirth and the puerperium: Secondary | ICD-10-CM | POA: Insufficient documentation

## 2017-11-03 DIAGNOSIS — L02415 Cutaneous abscess of right lower limb: Secondary | ICD-10-CM | POA: Diagnosis not present

## 2017-11-03 DIAGNOSIS — Z3A30 30 weeks gestation of pregnancy: Secondary | ICD-10-CM | POA: Insufficient documentation

## 2017-11-03 DIAGNOSIS — Z79899 Other long term (current) drug therapy: Secondary | ICD-10-CM | POA: Diagnosis not present

## 2017-11-03 MED ORDER — CEPHALEXIN 500 MG PO CAPS: 500.0000 mg | ORAL_CAPSULE | Freq: Three times a day (TID) | ORAL | 0 refills | Status: DC

## 2017-11-03 MED ORDER — LIDOCAINE HCL (PF) 1 % IJ SOLN
5.0000 mL | Freq: Once | INTRAMUSCULAR | Status: AC
  Administered 2017-11-03: 5 mL
  Filled 2017-11-03: qty 5

## 2017-11-03 NOTE — ED Provider Notes (Signed)
[facility] Emergency Department Provider Note  ____________________________________________   First MD Initiated Contact with Patient 11/03/17 1202     (approximate)  I have reviewed the triage vital signs and the nursing notes.   HISTORY  Chief Complaint Abscess   HPI Tracey Christensen is a 27 y.o. female presents to ED with complaint of abscess to her right thigh.  Patient states that it began getting larger in the last 3 days.  Patient is also [redacted] weeks pregnant.  She denies any fever, chills, nausea or vomiting.  This is the first time she has had an abscess.  She rates pain as a 10/10.  History reviewed. No pertinent past medical history.  There are no active problems to display for this patient.   Past Surgical History:  Procedure Laterality Date  . HAND SURGERY Right     Prior to Admission medications   Medication Sig Start Date End Date Taking? Authorizing Provider  cephALEXin (KEFLEX) 500 MG capsule Take 1 capsule (500 mg total) by mouth 3 (three) times daily. 11/03/17   Tommi Rumps, PA-C  famotidine (PEPCID) 20 MG tablet Take 1 tablet (20 mg total) by mouth 2 (two) times daily. 09/06/15   Sharman Cheek, MD  ibuprofen (ADVIL,MOTRIN) 200 MG tablet Take 400 mg by mouth every 6 (six) hours as needed.    [provider]  meloxicam (MOBIC) 15 MG tablet Take 1 tablet (15 mg total) by mouth daily. 03/16/17   Evon Slack, PA-C  methocarbamol (ROBAXIN) 500 MG tablet Take 1 tablet (500 mg total) by mouth 4 (four) times daily. 03/16/17   Evon Slack, PA-C    Allergies Patient has no known allergies.  No family history on file.  Social History Social History   Tobacco Use  . Smoking status: Never Smoker  . Smokeless tobacco: Never Used  Substance Use Topics  . Alcohol use: No  . Drug use: No    Review of Systems Constitutional: No fever/chills Cardiovascular: Denies chest pain. Respiratory: Denies shortness of  breath. Gastrointestinal: No abdominal pain.  No nausea, no vomiting.  Genitourinary: +30-week pregnancy. Musculoskeletal: Negative for back pain. Skin: Positive abscess right thigh. Neurological: Negative for headaches ___________________________________________   PHYSICAL EXAM:  VITAL SIGNS: ED Triage Vitals  Enc Vitals Group     BP 11/03/17 1155 119/86     Pulse Rate 11/03/17 1155 (!) 103     Resp 11/03/17 1155 16     Temp 11/03/17 1155 98.6 F (37 C)     Temp Source 11/03/17 1155 Oral     SpO2 11/03/17 1155 95 %     Weight 11/03/17 1149 238 lb (108 kg)     Height 11/03/17 1149 5\' 6"  (1.676 m)     Head Circumference --      Peak Flow --      Pain Score 11/03/17 1149 10     Pain Loc --      Pain Edu? --      Excl. in [place]? --    Constitutional: Alert and oriented. Well appearing and in no acute distress. Eyes: Conjunctivae are normal.  Head: Atraumatic. Neck: No stridor.   Cardiovascular: Normal rate, regular rhythm. Grossly normal heart sounds.  Good peripheral circulation. Respiratory: Normal respiratory effort.  No retractions. Lungs CTAB. Gastrointestinal: Soft and nontender. No distention.  Positive pregnant abdomen. Musculoskeletal: No lower extremity tenderness nor edema.   Neurologic:  Normal speech and language. No gross focal neurologic deficits  are appreciated. Skin:  Skin is warm, dry and intact.  There is a 3 cm erythematous abscess present in the right upper medial aspect of her thigh.  No drainage is present.  Area is tender to palpation.  There is a fluctuant area present.  No surrounding cellulitis is present. Psychiatric: Mood and affect are normal. Speech and behavior are normal.  ____________________________________________   LABS (all labs ordered are listed, but only abnormal results are displayed)  Labs Reviewed - No data to display  PROCEDURES  Procedure(s) performed:   Marland KitchenMarland KitchenIncision and Drainage Date/Time: 11/03/2017 5:06 PM Performed by:  Tommi Rumps, PA-C Authorized by: Tommi Rumps, PA-C   Consent:    Consent obtained:  Verbal   Consent given by:  Patient   Risks discussed:  Pain and incomplete drainage Location:    Type:  Abscess   Location:  Lower extremity   Lower extremity location:  Leg   Leg location:  R upper leg Pre-procedure details:    Skin preparation:  Antiseptic wash Anesthesia (see MAR for exact dosages):    Anesthesia method:  Local infiltration   Local anesthetic:  Lidocaine 1% w/o epi Procedure type:    Complexity:  Simple Procedure details:    Needle aspiration: no     Incision types:  Stab incision and single straight   Incision depth:  Subcutaneous   Scalpel blade:  11   Wound management:  Probed and deloculated and irrigated with saline   Drainage:  Purulent   Drainage amount:  Moderate   Wound treatment:  Drain placed and wound left open   Packing materials:  1/4 in iodoform gauze   Amount 1/4" iodoform:  5 cm Post-procedure details:    Patient tolerance of procedure:  Tolerated well, no immediate complications    Critical Care performed: No  ____________________________________________   INITIAL IMPRESSION / ASSESSMENT AND PLAN / ED COURSE  As part of my medical decision making, I reviewed the following data within the electronic MEDICAL RECORD NUMBER Notes from prior ED visits and [place] Controlled Substance Database   55-week old pregnant female presents to the ED with complaint of abscess to her right upper medial thigh.  Patient states she has not had any problems with abscesses in the past.  There is been no fever, chills, nausea or vomiting.  She denies any complications with her pregnancy.  Area was I&D without any difficulties and patient tolerated well.  1/4 inch iodoform gauze was placed and patient is aware that she will need to return to the ED in 2 days to have this removed  if it has not fallen out on its own at that time.  Patient was placed on Keflex 500 mg 3  times daily and she is aware that she can take Tylenol sparingly if needed for pain.  She will also begin using warm compresses to the area frequently.  She will return to the emergency department if any severe worsening of her symptoms.  ____________________________________________   FINAL CLINICAL IMPRESSION(S) / ED DIAGNOSES  Final diagnoses:  Abscess of right thigh     ED Discharge Orders         Ordered    cephALEXin (KEFLEX) 500 MG capsule  3 times daily     11/03/17 1250           Note:  This document was prepared using Dragon voice recognition software and may include unintentional dictation errors.    Tommi Rumps, PA-C 11/03/17 1710  Minna Antis, MD 11/04/17 2056

## 2017-11-03 NOTE — ED Triage Notes (Signed)
Pt reports abscess to her right thigh since Saturday that is getting bigger and more painful daily.

## 2017-11-03 NOTE — ED Notes (Signed)
Sterile dressing applied to patient abscess

## 2017-11-03 NOTE — Discharge Instructions (Signed)
Return to the emergency department in 2 days for packing removal if it is not already fallen out on its own.  Keflex 500 mg 3 times daily for 7 days.  Also use warm compresses to the area frequently.  He may take Tylenol sparingly if needed for pain.  Follow-up with your primary care provider or your OB/GYN if any continued problems.

## 2018-04-08 LAB — HM HIV SCREENING LAB: HM HIV Screening: NEGATIVE

## 2018-09-09 ENCOUNTER — Ambulatory Visit: Payer: Self-pay

## 2018-11-11 ENCOUNTER — Ambulatory Visit
Admission: EM | Admit: 2018-11-11 | Discharge: 2018-11-11 | Disposition: A | Discharge status: Home / Self Care | Payer: Medicaid Other | Attending: Family Medicine | Admitting: Family Medicine

## 2018-11-11 ENCOUNTER — Other Ambulatory Visit: Payer: Self-pay

## 2018-11-11 DIAGNOSIS — Z20828 Contact with and (suspected) exposure to other viral communicable diseases: Secondary | ICD-10-CM | POA: Diagnosis not present

## 2018-11-11 DIAGNOSIS — R05 Cough: Secondary | ICD-10-CM

## 2018-11-11 DIAGNOSIS — Z833 Family history of diabetes mellitus: Secondary | ICD-10-CM | POA: Diagnosis not present

## 2018-11-11 DIAGNOSIS — Z7189 Other specified counseling: Secondary | ICD-10-CM | POA: Insufficient documentation

## 2018-11-11 DIAGNOSIS — Z7952 Long term (current) use of systemic steroids: Secondary | ICD-10-CM | POA: Insufficient documentation

## 2018-11-11 DIAGNOSIS — J069 Acute upper respiratory infection, unspecified: Secondary | ICD-10-CM

## 2018-11-11 DIAGNOSIS — E669 Obesity, unspecified: Secondary | ICD-10-CM | POA: Insufficient documentation

## 2018-11-11 MED ORDER — BENZONATATE 100 MG PO CAPS: 100.0000 mg | ORAL_CAPSULE | Freq: Three times a day (TID) | ORAL | 0 refills | Status: DC | PRN

## 2018-11-11 MED ORDER — ALBUTEROL SULFATE HFA 108 (90 BASE) MCG/ACT IN AERS: 2.0000 | INHALATION_SPRAY | RESPIRATORY_TRACT | 0 refills | Status: DC | PRN

## 2018-11-11 MED ORDER — PREDNISONE 10 MG PO TABS: ORAL_TABLET | ORAL | 0 refills | Status: DC

## 2018-11-11 MED ORDER — HYDROCOD POLST-CPM POLST ER 10-8 MG/5ML PO SUER: 5.0000 mL | Freq: Every evening | ORAL | 0 refills | Status: DC | PRN

## 2018-11-11 NOTE — Discharge Instructions (Addendum)
Take medication as prescribed. Rest. Drink plenty of fluids.  ° °Follow up with your primary care physician this week as needed. Return to Urgent care or ER for new or worsening concerns.  ° °

## 2018-11-11 NOTE — ED Triage Notes (Signed)
Patient complains of cough since Friday with productivity. Patient denies any other symptoms.

## 2018-11-11 NOTE — ED Provider Notes (Addendum)
[facility] ____________________________________________  Time seen: Approximately 7:32 PM  I have reviewed the triage vital signs and the nursing notes.   HISTORY  Chief Complaint Cough   HPI Tracey Christensen is a 28 y.o. female presenting for evaluation of 4 days of cough.  Patient reports cough is mostly dry, but occasionally productive.  States that she has to cough a lot to get it to be productive.  Denies nasal congestion, sore throat, fevers, changes in taste or smell, rash, vomiting, diarrhea.  Denies chest pain or shortness of breath.  Reports her kids have recently been sick with some similar.  Continues eat and drink well.  Denies aggravating alleviating factors.  Reports otherwise doing well.  No recent sickness otherwise.  No LMP recorded. Patient has had an injection.Denies pregnancy.    History reviewed. No pertinent past medical history.  Patient Active Problem List   Diagnosis Date Noted  . Obesity (BMI 35.0-39.9 without comorbidity) 05/28/2017    Past Surgical History:  Procedure Laterality Date  . HAND SURGERY Right      No current facility-administered medications for this encounter.   Current Outpatient Medications:  .  albuterol (VENTOLIN HFA) 108 (90 Base) MCG/ACT inhaler, Inhale 2 puffs into the lungs every 4 (four) hours as needed for wheezing., Disp: 6.7 g, Rfl: 0 .  benzonatate (TESSALON PERLES) 100 MG capsule, Take 1 capsule (100 mg total) by mouth 3 (three) times daily as needed for cough., Disp: 15 capsule, Rfl: 0 .  chlorpheniramine-HYDROcodone (TUSSIONEX PENNKINETIC ER) 10-8 MG/5ML SUER, Take 5 mLs by mouth at bedtime as needed. do not drive or operate machinery while taking as can cause drowsiness., Disp: 40 mL, Rfl: 0 .  predniSONE (DELTASONE) 10 MG tablet, Start 60 mg po day one, then 50 mg po day two, taper by 10 mg daily until complete., Disp: 21 tablet, Rfl: 0  Allergies Patient has no known allergies.  Family History   Problem Relation Age of Onset  . Diabetes Maternal Grandmother   . Hypertension Paternal Grandmother   . Diabetes Paternal Grandfather     Social History Social History   Tobacco Use  . Smoking status: Never Smoker  . Smokeless tobacco: Never Used  Substance Use Topics  . Alcohol use: No  . Drug use: No    Review of Systems Constitutional: No fever. ENT: No sore throat. Negative nasal congestion.  Cardiovascular: Denies chest pain. Respiratory: Denies shortness of breath. Gastrointestinal: No abdominal pain.  No nausea, no vomiting.  No diarrhea.  Genitourinary: Negative for dysuria. Musculoskeletal: Negative for back pain. Skin: Negative for rash.   ____________________________________________   PHYSICAL EXAM:  VITAL SIGNS: ED Triage Vitals  Enc Vitals Group     BP 11/11/18 1836 (!) 127/92     Pulse Rate 11/11/18 1836 77     Resp 11/11/18 1836 17     Temp 11/11/18 1836 98.7 F (37.1 C)     Temp Source 11/11/18 1836 Oral     SpO2 11/11/18 1836 98 %     Weight 11/11/18 1832 218 lb (98.9 kg)     Height 11/11/18 1832 5\' 6"  (1.676 m)     Head Circumference --      Peak Flow --      Pain Score 11/11/18 1832 0     Pain Loc --      Pain Edu? --      Excl. in [place]? --     Constitutional: Alert and oriented. Well appearing  and in no acute distress. Eyes: Conjunctivae are normal.  Head: Atraumatic. No sinus tenderness to palpation. No swelling. No erythema.  Ears: no erythema, normal TMs bilaterally.   Nose:No nasal congestion   Mouth/Throat: Mucous membranes are moist. No pharyngeal erythema. No tonsillar swelling or exudate.  Neck: No stridor.  No cervical spine tenderness to palpation. Hematological/Lymphatic/Immunilogical: No cervical lymphadenopathy. Cardiovascular: Normal rate, regular rhythm. Grossly normal heart sounds. Good peripheral circulation. Respiratory: Normal respiratory effort.  No retractions. No rales or rhonchi.  Mild scattered wheezes, clears  with cough.  Good air movement. Intermittent dry cough in room.  Speaks in complete sentences. Musculoskeletal: Ambulatory with steady gait. No cervical, thoracic or lumbar tenderness to palpation. Neurologic:  Normal speech and language. No gait instability. Skin:  Skin appears warm, dry and intact. No rash noted. Psychiatric: Mood and affect are normal. Speech and behavior are normal. ___________________________________________   LABS (all labs ordered are listed, but only abnormal results are displayed)  Labs Reviewed  NOVEL CORONAVIRUS, NAA (HOSP ORDER, SEND-OUT TO REF LAB; TAT 18-24 HRS)     PROCEDURES Procedures    INITIAL IMPRESSION / ASSESSMENT AND PLAN / ED COURSE  Pertinent labs & imaging results that were available during my care of the patient were reviewed by me and considered in my medical decision making (see chart for details).  Well-appearing patient.  No acute distress.  Suspect viral upper respiratory infection.  COVID-19 testing completed and advice given.  Will treat with prednisone, albuterol inhaler, as needed Tessalon Perles and as needed Tussionex.  Encourage rest, fluids, supportive care.  Discuss strict follow-up and return parameters if no improvement or fevers. Discussed indication, risks and benefits of medications with patient.  Discussed follow up with Primary care physician this week as needed. Discussed follow up and return parameters including no resolution or any worsening concerns. Patient verbalized understanding and agreed to plan.   ____________________________________________   FINAL CLINICAL IMPRESSION(S) / ED DIAGNOSES  Final diagnoses:  Upper respiratory tract infection, unspecified type  Advice given about COVID-19 virus infection     ED Discharge Orders         Ordered    predniSONE (DELTASONE) 10 MG tablet     11/11/18 1902    albuterol (VENTOLIN HFA) 108 (90 Base) MCG/ACT inhaler  Every 4 hours PRN     11/11/18 1902     benzonatate (TESSALON PERLES) 100 MG capsule  3 times daily PRN     11/11/18 1902    chlorpheniramine-HYDROcodone (TUSSIONEX PENNKINETIC ER) 10-8 MG/5ML SUER  At bedtime PRN     11/11/18 1902           Note: This dictation was prepared with Dragon dictation along with smaller phrase technology. Any transcriptional errors that result from this process are unintentional.         Renford Dills, NP 11/11/18 1953

## 2018-11-14 LAB — NOVEL CORONAVIRUS, NAA (HOSP ORDER, SEND-OUT TO REF LAB; TAT 18-24 HRS): SARS-CoV-2, NAA: NOT DETECTED

## 2019-01-26 ENCOUNTER — Ambulatory Visit: Payer: Medicaid Other | Admitting: Family Medicine

## 2019-01-26 ENCOUNTER — Other Ambulatory Visit: Payer: Self-pay | Admitting: Family Medicine

## 2019-01-26 ENCOUNTER — Encounter: Payer: Self-pay | Admitting: Family Medicine

## 2019-01-26 ENCOUNTER — Other Ambulatory Visit: Payer: Self-pay

## 2019-01-26 VITALS — BP 114/77 | Ht 66.0 in | Wt 222.8 lb

## 2019-01-26 DIAGNOSIS — Z30011 Encounter for initial prescription of contraceptive pills: Secondary | ICD-10-CM

## 2019-01-26 DIAGNOSIS — Z113 Encounter for screening for infections with a predominantly sexual mode of transmission: Secondary | ICD-10-CM

## 2019-01-26 LAB — WET PREP FOR TRICH, YEAST, CLUE
Clue Cell Exam: POSITIVE — AB
Trichomonas Exam: NEGATIVE

## 2019-01-26 LAB — PREGNANCY, URINE: Preg Test, Ur: NEGATIVE

## 2019-01-26 MED ORDER — NORGESTIM-ETH ESTRAD TRIPHASIC 0.18/0.215/0.25 MG-35 MCG PO TABS: 1.0000 | ORAL_TABLET | Freq: Every day | ORAL | 0 refills | Status: DC

## 2019-01-26 NOTE — Progress Notes (Signed)
Patient here to start BCM. Last PE 03/2018. Interested in patch.Burt Knack, RN

## 2019-01-26 NOTE — Progress Notes (Signed)
Family Planning Visit  Subjective:  Tracey Christensen is a 29 y.o. being seen today for  Chief Complaint  Patient presents with  . Contraception    patch    Pt has Obesity (BMI 35.0-39.9 without comorbidity) on their problem list.  HPI  Patient reports she is here for [facility]. She just stopped Depo in August, didn't like weight gain and irregular periods. Has had Nexplanon, periods irregular, unable to easily remove. Does not want IUD or ring. Interested in patch but her bmi is >30 and after risks reviewed declines. States she may like to try OCPs again, has a hard time remembering to take.    Pt does not meet any of the following contraindications to estrogen use: -Age ?35 years and smoking ?15 cigarettes per day -Migraine with aura -Two or more RF for arterial CVD (such as older age, smoking, diabetes, and hypertension) -HTN -Breast cancer -VTE hx or acute event -Known thrombogenic mutations -Known ischemic heart disease -History of stroke -Complicated valvular heart disease (pulmonary HTN, risk for afib, hx subacute bacterial endocarditis) -Cirrhosis, Hepatocellular adenoma or malignant hepatoma    Patient's last menstrual period was 01/08/2019 (exact date). Last sex: 01/15/19 BCM: none Pt desires EC? n/a   Patient reports 1 partner(s) in last year. Do they desire STI screening (if no, why not)? Yes. Denies any symptoms.   Does the patient desire a pregnancy in the next year? no   29 y.o., Body mass index is 35.96 kg/m. - Is patient eligible for HA1C diabetes screening based on BMI and age >42?  no  Does the patient have a current or past history of drug use? no No components found for: HCV  See flowsheet for other program required questions.   Health Maintenance Due  Topic Date Due  . HIV Screening  09/25/2004  . TETANUS/TDAP  09/25/2008  . PAP-Cervical Cytology Screening  09/26/2010  . PAP SMEAR-Modifier  09/26/2010  . INFLUENZA VACCINE  08/15/2018    ROS  The  following portions of the patient's history were reviewed and updated as appropriate: allergies, current medications, past family history, past medical history, past social history, past surgical history and problem list. Problem list updated.  Objective:  BP 114/77   Ht 5\' 6"  (1.676 m)   Wt 222 lb 12.8 oz (101.1 kg)   LMP 01/08/2019 (Exact Date) Comment: normal  Breastfeeding No   BMI 35.96 kg/m   Physical Exam Vitals and nursing note reviewed.  Constitutional:      Appearance: Normal appearance.  HENT:     Head: Normocephalic and atraumatic.     Mouth/Throat:     Mouth: Mucous membranes are moist.     Pharynx: Oropharynx is clear. No oropharyngeal exudate or posterior oropharyngeal erythema.  Pulmonary:     Effort: Pulmonary effort is normal.  Abdominal:     General: Abdomen is flat.     Palpations: There is no mass.     Tenderness: There is no abdominal tenderness. There is no rebound.  Genitourinary:    Comments: Declines pelvic exam, prefers to self collect.  Lymphadenopathy:     Head:     Right side of head: No preauricular or posterior auricular adenopathy.     Left side of head: No preauricular or posterior auricular adenopathy.     Cervical: No cervical adenopathy.     Upper Body:     Right upper body: No supraclavicular or axillary adenopathy.     Left upper body: No supraclavicular or  axillary adenopathy.  Skin:    General: Skin is warm and dry.     Findings: No rash.  Neurological:     Mental Status: She is alert and oriented to person, place, and time.      Assessment and Plan:  Tracey Christensen is a 29 y.o. female presenting to the [facility] Department for a well woman exam/family planning visit  Contraception counseling: Reviewed all forms of birth control options in the tiered based approach including abstinence; over the counter/barrier methods; hormonal contraceptive medication including pill, patch, ring, injection, contraceptive implant;  hormonal and nonhormonal IUDs; permanent sterilization options including vasectomy and the various tubal sterilization modalities. Risks, benefits, how to discontinue and typical effectiveness rates were reviewed.  Questions were answered.  Written information was also given to the patient to review.  Patient desires OCPs, this was prescribed for patient. She will follow up in  1 year for surveillance.  She was told to call with any further questions, or with any concerns about this method of contraception.  Emphasized use of condoms 100% of the time for STI prevention.  ECP n/a    1. Encounter for initial prescription of contraceptive pills OCPs prescribed. Counseling as above. Discussed apps/alarms to remind to take pills, [employer] website given. - Norgestimate-Ethinyl Estradiol Triphasic (ORTHO TRI-CYCLEN, 28,) 0.18/0.215/0.25 MG-35 MCG tablet; Take 1 tablet by mouth daily.  Dispense: 13 Package; Refill: 0 - Pregnancy, urine  2. Screening examination for venereal disease -Screenings today as below. Treat wet prep per standing order. -Patient does not meet criteria for HepB, HepC Screening.  -Counseled on warning s/sx and when to seek care. Recommended condom use with all sex and discussed importance of condom use for STI prevention. - Chlamydia/Gonorrhea [facility] - HIV [facility] - Syphilis Serology, [facility] - WET PREP FOR [place]   Return in about 3 months (around 04/26/2019) for yearly wellness exam.  No future appointments.  Kandee Keen, PA-C

## 2019-01-26 NOTE — Progress Notes (Signed)
Wet mount reviewed, no treatment indicated. Patient counseled to schedule PE in March or April, she states understanding.Marland KitchenMarland KitchenBurt Knack, RN

## 2019-01-27 ENCOUNTER — Other Ambulatory Visit: Payer: Self-pay | Admitting: Family Medicine

## 2019-01-27 DIAGNOSIS — Z30011 Encounter for initial prescription of contraceptive pills: Secondary | ICD-10-CM

## 2019-01-27 MED ORDER — NORGESTIM-ETH ESTRAD TRIPHASIC 0.18/0.215/0.25 MG-35 MCG PO TABS: 1.0000 | ORAL_TABLET | Freq: Every day | ORAL | 0 refills | Status: DC

## 2019-03-05 ENCOUNTER — Ambulatory Visit
Admission: EM | Admit: 2019-03-05 | Discharge: 2019-03-05 | Disposition: A | Discharge status: Home / Self Care | Payer: Medicaid Other | Attending: Family Medicine | Admitting: Family Medicine

## 2019-03-05 ENCOUNTER — Other Ambulatory Visit: Payer: Self-pay

## 2019-03-05 ENCOUNTER — Encounter: Payer: Self-pay | Admitting: Emergency Medicine

## 2019-03-05 DIAGNOSIS — Z20822 Contact with and (suspected) exposure to covid-19: Secondary | ICD-10-CM | POA: Diagnosis not present

## 2019-03-05 NOTE — ED Provider Notes (Signed)
[facility]    CSN: [id number] Arrival date & time: 03/05/19  1231      History   Chief Complaint Chief Complaint  Patient presents with  . COVID Test    no symptoms    HPI MOREEN PIGGOTT is a 29 y.o. female.   29 yo female here for covid testing. Two of her children have URI symptoms and are getting tested for covid. Patient denies any symptoms.      History reviewed. No pertinent past medical history.  Patient Active Problem List   Diagnosis Date Noted  . Obesity (BMI 35.0-39.9 without comorbidity) 05/28/2017    Past Surgical History:  Procedure Laterality Date  . HAND SURGERY Right     OB History    Gravida  1   Para      Term      Preterm      AB      Living        SAB      TAB      Ectopic      Multiple      Live Births               Home Medications    Prior to Admission medications   Medication Sig Start Date End Date Taking? Authorizing Provider  Norgestimate-Ethinyl Estradiol Triphasic (ORTHO TRI-CYCLEN, 28,) 0.18/0.215/0.25 MG-35 MCG tablet Take 1 tablet by mouth daily. 01/27/19  Yes Federico Flake, MD  famotidine (PEPCID) 20 MG tablet Take 1 tablet (20 mg total) by mouth 2 (two) times daily. 09/06/15 11/11/18  Sharman Cheek, MD    Family History Family History  Problem Relation Age of Onset  . Diabetes Maternal Grandmother   . Hypertension Paternal Grandmother   . Diabetes Paternal Grandfather     Social History Social History   Tobacco Use  . Smoking status: Never Smoker  . Smokeless tobacco: Never Used  Substance Use Topics  . Alcohol use: Yes    Comment: 1x/mo  . Drug use: No     Allergies   Patient has no known allergies.   Review of Systems Review of Systems   Physical Exam Triage Vital Signs ED Triage Vitals  Enc Vitals Group     BP 03/05/19 1305 132/79     Pulse Rate 03/05/19 1305 75     Resp 03/05/19 1305 14     Temp 03/05/19 1305 98.4 F (36.9 C)     Temp Source  03/05/19 1305 Oral     SpO2 03/05/19 1305 96 %     Weight 03/05/19 1303 210 lb (95.3 kg)     Height 03/05/19 1303 5\' 5"  (1.651 m)     Head Circumference --      Peak Flow --      Pain Score 03/05/19 1303 0     Pain Loc --      Pain Edu? --      Excl. in [place]? --    No data found.  Updated Vital Signs BP 132/79 (BP Location: Right Arm)   Pulse 75   Temp 98.4 F (36.9 C) (Oral)   Resp 14   Ht 5\' 5"  (1.651 m)   Wt 95.3 kg   LMP 03/02/2019 (Exact Date)   SpO2 96%   BMI 34.95 kg/m   Visual Acuity Right Eye Distance:   Left Eye Distance:   Bilateral Distance:    Right Eye Near:   Left Eye Near:    Bilateral  Near:     Physical Exam Vitals and nursing note reviewed.  Constitutional:      General: She is not in acute distress.    Appearance: She is not toxic-appearing or diaphoretic.  Cardiovascular:     Rate and Rhythm: Normal rate.  Pulmonary:     Effort: Pulmonary effort is normal. No respiratory distress.  Neurological:     Mental Status: She is alert.      UC Treatments / Results  Labs (all labs ordered are listed, but only abnormal results are displayed) Labs Reviewed  NOVEL CORONAVIRUS, NAA (HOSP ORDER, SEND-OUT TO REF LAB; TAT 18-24 HRS)    EKG   Radiology No results found.  Procedures Procedures (including critical care time)  Medications Ordered in UC Medications - No data to display  Initial Impression / Assessment and Plan / UC Course  I have reviewed the triage vital signs and the nursing notes.  Pertinent labs & imaging results that were available during my care of the patient were reviewed by me and considered in my medical decision making (see chart for details).      Final Clinical Impressions(s) / UC Diagnoses   Final diagnoses:  Encounter for laboratory testing for COVID-19 virus    ED Prescriptions    None      1. diagnosis reviewed with patient 2. covid test done 3. F/u prn  PDMP not reviewed this encounter.    Norval Gable, MD 03/05/19 1423

## 2019-03-05 NOTE — ED Triage Notes (Signed)
Mother denies any symptoms but wants to be tested for COVID.

## 2019-03-06 LAB — NOVEL CORONAVIRUS, NAA (HOSP ORDER, SEND-OUT TO REF LAB; TAT 18-24 HRS): SARS-CoV-2, NAA: NOT DETECTED

## 2019-05-12 ENCOUNTER — Ambulatory Visit: Payer: Medicaid Other | Admitting: Advanced Practice Midwife

## 2019-05-12 ENCOUNTER — Encounter: Payer: Self-pay | Admitting: Advanced Practice Midwife

## 2019-05-12 ENCOUNTER — Other Ambulatory Visit: Payer: Self-pay

## 2019-05-12 VITALS — BP 122/78 | Ht 66.0 in | Wt 230.6 lb

## 2019-05-12 DIAGNOSIS — Z3009 Encounter for other general counseling and advice on contraception: Secondary | ICD-10-CM

## 2019-05-12 DIAGNOSIS — Z30017 Encounter for initial prescription of implantable subdermal contraceptive: Secondary | ICD-10-CM | POA: Diagnosis not present

## 2019-05-12 DIAGNOSIS — E669 Obesity, unspecified: Secondary | ICD-10-CM | POA: Insufficient documentation

## 2019-05-12 DIAGNOSIS — O99211 Obesity complicating pregnancy, first trimester: Secondary | ICD-10-CM | POA: Insufficient documentation

## 2019-05-12 MED ORDER — ETONOGESTREL 68 MG ~~LOC~~ IMPL
68.0000 mg | DRUG_IMPLANT | Freq: Once | SUBCUTANEOUS | Status: AC
  Administered 2019-05-12: 10:00:00 68 mg via SUBCUTANEOUS

## 2019-05-12 NOTE — Progress Notes (Signed)
   [facility]  Subjective:  Tracey Christensen is a 29 y.o. SBF G3P3 nonsmoker being seen today for Nexplanon insertion  Chief Complaint  Patient presents with  . Contraception    HPI  LMP 05/11/19.  Last sex 05/08/19 with condom.  Last pap 06/25/17 neg.  Last PE 04/08/18.  Has had 3 Nexplanons in past and last removed 01/2017. Does the patient have a current or past history of drug use? No   No components found for: HCV]   Health Maintenance Due  Topic Date Due  . COVID-19 Vaccine (1) Never done    ROS  The following portions of the patient's history were reviewed and updated as appropriate: allergies, current medications, past family history, past medical history, past social history, past surgical history and problem list. Problem list updated.   See flowsheet for other program required questions.  Objective:   Vitals:   05/12/19 0848  BP: 122/78  Weight: 230 lb 9.6 oz (104.6 kg)  Height: 5\' 6"  (1.676 m)    Physical Exam  n/a  Assessment and Plan:  Tracey Christensen is a 29 y.o. female presenting to the [facility] Department for a Women's Health problem visit  1. Obesity, unspecified classification, unspecified obesity type, unspecified whether serious comorbidity present   2. Family planning   3. Encounter for initial prescription of implantable subdermal contraceptive Nexplanon Insertion Procedure Patient identified, informed consent performed, consent signed.   Patient does understand that irregular bleeding is a very common side effect of this medication. She was advised to have backup contraception after placement. Patient was determined to meet WHO criteria for not being pregnant. Appropriate time out taken.  The insertion site was identified 8-10 cm (3-4 inches) from the medial epicondyle of the humerus and 3-5 cm (1.25-2 inches) posterior to (below) the sulcus (groove) between the biceps  and triceps muscles of the patient's left arm and marked.  Patient was prepped with alcohol swab and then injected with 3 ml of 1% lidocaine.  Arm was prepped with chlorhexidene, Nexplanon removed from packaging,  Device confirmed in needle, then inserted full length of needle and withdrawn per handbook instructions. Nexplanon was able to palpated in the patient's arm; patient palpated the insert herself. There was minimal blood loss.  Patient insertion site covered with guaze and a pressure bandage to reduce any bruising.  The patient tolerated the procedure well and was given post procedure instructions.  Nexplanon:   Counseled patient to take OTC analgesic starting as soon as lidocaine starts to wear off and take regularly for at least 48 hr to decrease discomfort.  Specifically to take with food or milk to decrease stomach upset and for IB 600 mg (3 tablets) every 6 hrs; IB 800 mg (4 tablets) every 8 hrs; or Aleve 2 tablets every 12 hrs.   - etonogestrel (NEXPLANON) implant 68 mg     Return if symptoms worsen or fail to improve.  No future appointments.  [facility], CNM

## 2019-05-12 NOTE — Progress Notes (Addendum)
Here today for Nexplanon placement. Last PE here was 04/08/2018, last Pap was 02/25/2017 (normal.)  Tawny Hopping, RN

## 2019-06-10 ENCOUNTER — Telehealth: Payer: Self-pay | Admitting: Family Medicine

## 2019-06-10 NOTE — Telephone Encounter (Signed)
States Nexplanon gives her migrains every day, and wants it removed.

## 2019-06-10 NOTE — Telephone Encounter (Signed)
Call to client and physical appt with Nexplanon removal scheduled for 6/9/20212. Per client, desires Nexplanon removal as causing migraines. Unsure of desired BCM and wants to talk with provider concerning options. Jossie Ng, RN

## 2019-06-23 ENCOUNTER — Encounter: Payer: Self-pay | Admitting: Advanced Practice Midwife

## 2019-06-23 ENCOUNTER — Ambulatory Visit: Payer: Self-pay

## 2019-06-23 ENCOUNTER — Other Ambulatory Visit: Payer: Self-pay

## 2019-06-23 ENCOUNTER — Ambulatory Visit (LOCAL_COMMUNITY_HEALTH_CENTER): Payer: Medicaid Other | Admitting: Advanced Practice Midwife

## 2019-06-23 VITALS — BP 116/79 | Ht 66.0 in | Wt 228.0 lb

## 2019-06-23 DIAGNOSIS — Z3049 Encounter for surveillance of other contraceptives: Secondary | ICD-10-CM

## 2019-06-23 DIAGNOSIS — Z3009 Encounter for other general counseling and advice on contraception: Secondary | ICD-10-CM | POA: Diagnosis not present

## 2019-06-23 DIAGNOSIS — Z308 Encounter for other contraceptive management: Secondary | ICD-10-CM

## 2019-06-23 NOTE — Progress Notes (Addendum)
Here today for PE and Nexplanon removal. Last PE here was 04/08/2018 and last Pap Smear was 02/25/2017 (NIL.) Nexplanon placed here 05/12/2019. Patient states has had "non stop Migraines with this Nexplanon." Declines STD screening today. States was prescribed pills here at a different appt before having Nexplanon placed and wants to start those for alternate birth control. Tawny Hopping, RN

## 2019-06-23 NOTE — Progress Notes (Signed)
Family Planning Visit- Initial Visit  Subjective:  Tracey Christensen is a 29 y.o. SBF nonsmoker G3P2103   being seen today for an initial well woman visit and to discuss family planning options.  She is currently using Nexplanon for pregnancy prevention. Patient reports she does not want a pregnancy in the next year.  Patient has the following medical conditions has Obesity BMI=37.2 on their problem list.  Chief Complaint  Patient presents with  . Procedure    Nexplanon removal    Patient reports Nexplanon inserted 05/12/19 and has been having h/a 4-5x/wk in different places on head, -N&V, -neuro sxs, -vision changes since Nexplanon placed.  LMP 05/11/19.  Last sex 06/19/19 with condom; 1 partner in last 3 mo and this partner x 1.5 years.  Last ETOH 06/19/19 (1 mixed drink) "once in a blue moon".  Last pap 02/25/2017 neg.  Last PE 04/08/18  Patient denies any chronic medical conditions  Body mass index is 36.8 kg/m. - Patient is eligible for diabetes screening based on BMI and age >37?  not applicable HA1C ordered? not applicable  Patient reports 1 of partners in last year. Desires STI screening?  No - pt refuses STD screen  Has patient been screened once for HCV in the past?  No  No results found for: HCVAB  Does the patient have current of drug use, have a partner with drug use, and/or has been incarcerated since last result? No  If yes-- Screen for HCV through [facility]   Does the patient meet criteria for HBV testing? No  Criteria:  -Household, sexual or needle sharing contact with HBV -History of drug use -HIV positive -Those with known Hep C   Health Maintenance Due  Topic Date Due  . Hepatitis C Screening  Never done  . COVID-19 Vaccine (1) Never done    Review of Systems  All other systems reviewed and are negative.   The following portions of the patient's history were reviewed and updated as appropriate: allergies, current medications, past family history, past  medical history, past social history, past surgical history and problem list. Problem list updated.   See flowsheet for other program required questions.  Objective:   Vitals:   06/23/19 0838  BP: 116/79  Weight: 228 lb (103.4 kg)  Height: 5\' 6"  (1.676 m)    Physical Exam Constitutional:      Appearance: Normal appearance. She is obese.  HENT:     Head: Normocephalic and atraumatic.     Mouth/Throat:     Mouth: Mucous membranes are moist.  Eyes:     Conjunctiva/sclera: Conjunctivae normal.  Cardiovascular:     Rate and Rhythm: Normal rate and regular rhythm.  Pulmonary:     Effort: Pulmonary effort is normal.     Breath sounds: Normal breath sounds.  Chest:     Breasts:        Right: Normal.        Left: Normal.  Abdominal:     Palpations: Abdomen is soft.     Comments: Poor tone, soft without tenderness, increased adipose  Genitourinary:    Comments: Exam not done due to pt request.  Pap not due, monogomous relationship, denies sxs vaginitis, 29 yo Musculoskeletal:        General: Normal range of motion.     Cervical back: Normal range of motion and neck supple.  Skin:    General: Skin is warm and dry.  Neurological:     Mental  Status: She is alert.  Psychiatric:        Mood and Affect: Mood normal.       Assessment and Plan:  Tracey Christensen is a 29 y.o. female presenting [facility] Department for an initial well woman exam/family planning visit  Contraception counseling: Reviewed all forms of birth control options in the tiered based approach. available including abstinence; over the counter/barrier methods; hormonal contraceptive medication including pill, patch, ring, injection,contraceptive implant, ECP; hormonal and nonhormonal IUDs; permanent sterilization options including vasectomy and the various tubal sterilization modalities. Risks, benefits, and typical effectiveness rates were reviewed.  Questions were answered.  Written  information was also given to the patient to review.  Patient desires Nexplanon removal and ocp initiation, this was prescribed for patient. She will follow up in 1 year for surveillance.  She was told to call with any further questions, or with any concerns about this method of contraception.  Emphasized use of condoms 100% of the time for STI prevention.  Patient was offered ECP. ECP was not accepted by the patient. ECP counseling was not given - see RN documentation  1. Family planning Pt desires Nexplanor removal and ocp initiation.  Pt states Dr. Ernestina Patches called in #13 packs of ocp's on 01/27/19 and wants to begin using those Counseled to begin ocp today and abstinance/condoms next 7 days with sex  2. Encounter for other contraceptive management Nexplanon Removal Patient identified, informed consent performed, consent signed.   Appropriate time out taken. Nexplanon site identified.  Area prepped in usual sterile fashon. 3 ml of 1% lidocaine with Epinephrine was used to anesthetize the area at the distal end of the implant and along implant site. A small stab incision was made right beside the implant on the distal portion.  The Nexplanon rod was grasped using straight hemostats/manual and removed without difficulty.  There was minimal blood loss. There were no complications.  Steri-strips were applied over the small incision.  A pressure bandage was applied to reduce any bruising.  The patient tolerated the procedure well and was given post procedure instructions.   Nexplanon:   Counseled patient to take OTC analgesic starting as soon as lidocaine starts to wear off and take regularly for at least 48 hr to decrease discomfort.  Specifically to take with food or milk to decrease stomach upset and for IB 600 mg (3 tablets) every 6 hrs; IB 800 mg (4 tablets) every 8 hrs; or Aleve 2 tablets every 12 hrs.       No follow-ups on file.  No future appointments.  Herbie Saxon, CNM

## 2019-07-21 ENCOUNTER — Other Ambulatory Visit: Payer: Self-pay

## 2019-07-21 ENCOUNTER — Ambulatory Visit (LOCAL_COMMUNITY_HEALTH_CENTER): Payer: Medicaid Other

## 2019-07-21 VITALS — BP 125/89 | Wt 231.0 lb

## 2019-07-21 DIAGNOSIS — Z3202 Encounter for pregnancy test, result negative: Secondary | ICD-10-CM

## 2019-07-21 LAB — PREGNANCY, URINE

## 2019-07-21 NOTE — Progress Notes (Signed)
Pt notified of threshold results and need for repeat testing in approximately one week. She would like to go to her previous OBGYN and request a confirmatory blood HCG test. Advised to call back if she wants follow up appt at [facility].Vernelle Emerald, RN

## 2019-07-26 ENCOUNTER — Ambulatory Visit (INDEPENDENT_AMBULATORY_CARE_PROVIDER_SITE_OTHER): Payer: Medicaid Other | Admitting: *Deleted

## 2019-07-26 ENCOUNTER — Other Ambulatory Visit: Payer: Self-pay

## 2019-07-26 DIAGNOSIS — Z3200 Encounter for pregnancy test, result unknown: Secondary | ICD-10-CM

## 2019-07-26 DIAGNOSIS — Z3201 Encounter for pregnancy test, result positive: Secondary | ICD-10-CM | POA: Diagnosis not present

## 2019-07-26 LAB — POCT PREGNANCY, URINE: Preg Test, Ur: POSITIVE — AB

## 2019-07-26 NOTE — Progress Notes (Signed)
Tracey Christensen dropped off a urine for a urine pregnancy test which was positiive. I called her to inform her of the results. She confirmed [name] was 06/24/19 , this makes her [redacted]w[redacted]d with EDD 03/30/2020.  I reviewed her medications with her. I advised her to start prenatal vitamins and prenatal care. She would like to get her prenatal care with our office . I advised her to call our office appointment line or send a MYChart message. She voices understanding. Shalva Rozycki,RN

## 2019-08-10 ENCOUNTER — Ambulatory Visit (INDEPENDENT_AMBULATORY_CARE_PROVIDER_SITE_OTHER): Payer: Medicaid Other | Admitting: Advanced Practice Midwife

## 2019-08-10 ENCOUNTER — Other Ambulatory Visit (HOSPITAL_COMMUNITY)
Admission: RE | Admit: 2019-08-10 | Discharge: 2019-08-10 | Disposition: A | Discharge status: Home / Self Care | Payer: Medicaid Other | Source: Ambulatory Visit | Attending: Advanced Practice Midwife | Admitting: Advanced Practice Midwife

## 2019-08-10 ENCOUNTER — Other Ambulatory Visit: Payer: Self-pay

## 2019-08-10 ENCOUNTER — Encounter: Payer: Self-pay | Admitting: Advanced Practice Midwife

## 2019-08-10 VITALS — BP 126/74 | Wt 232.0 lb

## 2019-08-10 DIAGNOSIS — O0991 Supervision of high risk pregnancy, unspecified, first trimester: Secondary | ICD-10-CM | POA: Insufficient documentation

## 2019-08-10 DIAGNOSIS — O09299 Supervision of pregnancy with other poor reproductive or obstetric history, unspecified trimester: Secondary | ICD-10-CM

## 2019-08-10 DIAGNOSIS — Z113 Encounter for screening for infections with a predominantly sexual mode of transmission: Secondary | ICD-10-CM | POA: Diagnosis present

## 2019-08-10 DIAGNOSIS — O99211 Obesity complicating pregnancy, first trimester: Secondary | ICD-10-CM

## 2019-08-10 NOTE — Progress Notes (Signed)
NOB today. LMP: 06/24/2019

## 2019-08-10 NOTE — Progress Notes (Signed)
New Obstetric Patient H&P    Chief Complaint: "Desires prenatal care"   History of Present Illness: Patient is a 29 y.o. [phone] Not Hispanic or Latino female, presents with amenorrhea and positive home pregnancy test. Patient's last menstrual period was 06/24/2019 (exact date). and based on her  LMP, her EDD is Estimated Date of Delivery: 03/30/20 and her EGA is [redacted]w[redacted]d. Cycles are 3-4 days, regular, and occur approximately every : 28 days. Her last pap smear was 2 years ago and was no abnormalities.    She had a urine pregnancy test which was positive 3 week(s)  ago. Her last menstrual period was normal and lasted for  3 or 4 day(s). Since [name] she claims she has experienced breast tenderness, fatigue, nausea, vomiting. She denies vaginal bleeding. Her past medical history is noncontributory. Her prior pregnancies are notable for G1 preterm svd 33 weeks 5 pounds female 2013, G2 full term svd 37 weeks 6 pounds female 2015, G3 full term svd 39 weeks 10 pounds female 2019. History of preeclampsia with G2. History negative for GDM.  Since [name], she admits to the use of tobacco products  no She claims she has gained   4 pounds since the start of her pregnancy.  There are cats in the home in the home  no  She admits close contact with children on a regular basis  yes  She has had chicken pox in the past no She has had Tuberculosis exposures, symptoms, or previously tested positive for TB   no Current or past history of domestic violence. no  Genetic Screening/Teratology Counseling: (Includes patient, baby's father, or anyone in either family with:)   1. Patient's age >/= 19 [facility]  no 2. Thalassemia ([place], [place], Mediterranean, or Asian background): MCV<80  no 3. Neural tube defect (meningomyelocele, spina bifida, anencephaly)  no 4. Congenital heart defect  no  5. Down syndrome  no 6. Tay-Sachs (Jewish, [place]  no 7. Canavan's Disease  no 8. Sickle cell disease or trait  (African)  no  9. Hemophilia or other blood disorders  no  10. Muscular dystrophy  no  11. Cystic fibrosis  no  12. Huntington's Chorea  no  13. Mental retardation/autism  no 14. Other inherited genetic or chromosomal disorder  no 15. Maternal metabolic disorder (DM, PKU, etc)  no 16. Patient or FOB with a child with a birth defect not listed above no  16a. Patient or FOB with a birth defect themselves no 17. Recurrent pregnancy loss, or stillbirth  no  18. Any medications since LMP other than prenatal vitamins (include vitamins, supplements, OTC meds, drugs, alcohol)  no 19. Any other genetic/environmental exposure to discuss  no  Infection History:   1. Lives with someone with TB or TB exposed  no  2. Patient or partner has history of genital herpes  no 3. Rash or viral illness since LMP  no 4. History of STI (GC, CT, HPV, syphilis, HIV)  no 5. History of recent travel :  no  Other pertinent information:  no     Review of Systems:10 point review of systems negative unless otherwise noted in HPI  Past Medical History:  Patient Active Problem List   Diagnosis Date Noted  . Supervision of high risk pregnancy in first trimester 08/10/2019    Clinic [facility] Prenatal Labs  Dating  Blood type:     Genetic Screen 1 Screen:    AFP:     Quad:  NIPS: Antibody:   Anatomic [place]  Rubella:    Varicella: @VZVIGG @  GTT Early:                Third trimester:  RPR:     Rhogam  HBsAg:     Vaccines TDAP:                       Flu Shot: HIV:     Baby Food Formula                               GBS:   GC/CT:  Contraception  Pap: 2019 negative  CBB     CS/VBAC NA   Support Person 2020       . Obesity complicating pregnancy in first trimester 05/12/2019  . History of pre-eclampsia in prior pregnancy, currently pregnant 05/28/2017    Formatting of this note might be different from the original. History of Pre-Eclampsia requiring induction of labor at 38 weeks.  Reviewed starting 81  mg of aspirin at 14 weeks-- pt never started  HELLP labs wnl UPC 0.041  11/8: Pt not taking LDASA, BP 132/77, counseled re: si/sx preX  Last Assessment & Plan:  Formatting of this note might be different from the original. BP normal today. Patient asymptomatic. Patient not taking LDASA. Counseled about pre-E symptoms.     Past Surgical History:  Past Surgical History:  Procedure Laterality Date  . HAND SURGERY Right     Gynecologic History: Patient's last menstrual period was 06/24/2019 (exact date).  Obstetric History: 08/24/2019  Family History:  Family History  Problem Relation Age of Onset  . Diabetes Maternal Grandmother   . Hypertension Paternal Grandmother   . Diabetes Paternal Grandfather     Social History:  Social History   Socioeconomic History  . Marital status: Single    Spouse name: Not on file  . Number of children: Not on file  . Years of education: Not on file  . Highest education level: Not on file  Occupational History  . Not on file  Tobacco Use  . Smoking status: Never Smoker  . Smokeless tobacco: Never Used  Vaping Use  . Vaping Use: Never used  Substance and Sexual Activity  . Alcohol use: Not Currently  . Drug use: No  . Sexual activity: Yes    Partners: Male    Birth control/protection: None  Other Topics Concern  . Not on file  Social History Narrative  . Not on file   Social Determinants of Health   Financial Resource Strain:   . Difficulty of Paying Living Expenses:   Food Insecurity:   . Worried About [id number] in the Last Year:   . Programme researcher, broadcasting/film/video in the Last Year:   Transportation Needs:   . Barista (Medical):   Freight forwarder Lack of Transportation (Non-Medical):   Physical Activity:   . Days of Exercise per Week:   . Minutes of Exercise per Session:   Stress:   . Feeling of Stress :   Social Connections:   . Frequency of Communication with Friends and Family:   . Frequency of Social Gatherings with  Friends and Family:   . Attends Religious Services:   . Active Member of Clubs or Organizations:   . Attends [employer] Meetings:   Banker Marital Status:   Intimate Partner Violence: Not At Risk  .  Fear of Current or Ex-Partner: No  . Emotionally Abused: No  . Physically Abused: No  . Sexually Abused: No    Allergies:  No Known Allergies  Medications: Prior to Admission medications   Medication Sig Start Date End Date Taking? Authorizing Provider  Prenatal Vit-Fe Fumarate-FA (MULTIVITAMIN-PRENATAL) 27-0.8 MG TABS tablet Take 1 tablet by mouth daily at 12 noon.   Yes [provider]  famotidine (PEPCID) 20 MG tablet Take 1 tablet (20 mg total) by mouth 2 (two) times daily. 09/06/15 11/11/18  Sharman Cheek, MD    Physical Exam Vitals: Blood pressure 126/74, weight (!) 232 lb (105.2 kg), last menstrual period 06/24/2019.  General: NAD HEENT: normocephalic, anicteric Thyroid: no enlargement, no palpable nodules Pulmonary: No increased work of breathing, CTAB Cardiovascular: RRR, distal pulses 2+ Abdomen: NABS, soft, non-tender, non-distended.  Umbilicus without lesions.  No hepatomegaly, splenomegaly or masses palpable. No evidence of hernia  Genitourinary:  External: Normal external female genitalia.  Normal urethral meatus, normal  Bartholin's and Skene's glands.    Vagina: Normal vaginal mucosa, no evidence of prolapse.    Cervix: not evaluated, aptima swab only  Uterus:  deferred             Adnexa: deferred  Rectal: deferred Extremities: no edema, erythema, or tenderness Neurologic: Grossly intact Psychiatric: mood appropriate, affect full  The following were addressed during this visit:  Breastfeeding Education - Individualized Education    Comments: Contraindications to breastfeeding and other special medical conditions Patient has had previous breastfeeding education and  unsuccessful attempts at breastfeeding and she declines education today.      Assessment: 29 y.o. S2G3151 at [redacted]w[redacted]d presenting to initiate prenatal care  Plan: 1) Avoid alcoholic beverages. 2) Patient encouraged not to smoke.  3) Discontinue the use of all non-medicinal drugs and chemicals.  4) Take prenatal vitamins daily.  5) Nutrition, food safety (fish, cheese advisories, and high nitrite foods) and exercise discussed. 6) Hospital and practice style discussed with cross coverage system.  7) Genetic Screening, such as with 1st Trimester Screening, cell free fetal DNA, AFP testing, and Ultrasound, as well as with amniocentesis and CVS as appropriate, is discussed with patient. At the conclusion of today's visit patient requested cell free dna genetic testing 8) Patient is asked about travel to areas at risk for the Zika virus, and counseled to avoid travel and exposure to mosquitoes or sexual partners who may have themselves been exposed to the virus. Testing is discussed, and will be ordered as appropriate.  9) Aptima, urine culture today 10) Return to clinic in 1 week for dating scan, early 1 hr gtt and other future ordered labs and rob 33) MaterniT 21 at 10+ weeks   Tresea Mall, CNM [facility] 08/10/2019, 10:18 AM

## 2019-08-10 NOTE — Patient Instructions (Signed)
Exercise During Pregnancy Exercise is an important part of being healthy for people of all ages. Exercise improves the function of your heart and lungs and helps you maintain strength, flexibility, and a healthy body weight. Exercise also boosts energy levels and elevates mood. Most women should exercise regularly during pregnancy. In rare cases, women with certain medical conditions or complications may be asked to limit or avoid exercise during pregnancy. How does this affect me? Along with maintaining general strength and flexibility, exercising during pregnancy can help:  Keep strength in muscles that are used during labor and childbirth.  Decrease low back pain.  Reduce symptoms of depression.  Control weight gain during pregnancy.  Reduce the risk of needing insulin if you develop diabetes during pregnancy.  Decrease the risk of cesarean delivery.  Speed up your recovery after giving birth. How does this affect my baby? Exercise can help you have a healthy pregnancy. Exercise does not cause premature birth. It will not cause your baby to weigh less at birth. What exercises can I do? Many exercises are safe for you to do during pregnancy. Do a variety of exercises that safely increase your heart and breathing rates and help you build and maintain muscle strength. Do exercises exactly as told by your health care provider. You may do these exercises:  Walking or hiking.  Swimming.  Water aerobics.  Riding a stationary bike.  Strength training.  Modified yoga or Pilates. Tell your instructor that you are pregnant. Avoid overstretching, and avoid lying on your back for long periods of time.  Running or jogging. Only choose this type of exercise if you: ? Ran or jogged regularly before your pregnancy. ? Can run or jog and still talk in complete sentences. What exercises should I avoid? Depending on your level of fitness and whether you exercised regularly before your  pregnancy, you may be told to limit high-intensity exercise. You can tell that you are exercising at a high intensity if you are breathing much harder and faster and cannot hold a conversation while exercising. You must avoid:  Contact sports.  Activities that put you at risk for falling on or being hit in the belly, such as downhill skiing, water skiing, surfing, rock climbing, cycling, gymnastics, and horseback riding.  Scuba diving.  Skydiving.  Yoga or Pilates in a room that is heated to high temperatures.  Jogging or running, unless you ran or jogged regularly before your pregnancy. While jogging or running, you should always be able to talk in full sentences. Do not run or jog so fast that you are unable to have a conversation.  Do not exercise at more than 6,000 feet above sea level (high elevation) if you are not used to exercising at high elevation. How do I exercise in a safe way?   Avoid overheating. Do not exercise in very high temperatures.  Wear loose-fitting, breathable clothes.  Avoid dehydration. Drink enough water before, during, and after exercise to keep your urine pale yellow.  Avoid overstretching. Because of hormone changes during pregnancy, it is easy to overstretch muscles, tendons, and ligaments during pregnancy.  Start slowly and ask your health care provider to recommend the types of exercise that are safe for you.  Do not exercise to lose weight. Follow these instructions at home:  Exercise on most days or all days of the week. Try to exercise for 30 minutes a day, 5 days a week, unless your health care provider tells you not to.  If   you actively exercised before your pregnancy and you are healthy, your health care provider may tell you to continue to do moderate to high-intensity exercise.  If you are just starting to exercise or did not exercise much before your pregnancy, your health care provider may tell you to do low to moderate-intensity  exercise. Questions to ask your health care provider  Is exercise safe for me?  What are signs that I should stop exercising?  Does my health condition mean that I should not exercise during pregnancy?  When should I avoid exercising during pregnancy? Stop exercising and contact a health care provider if: You have any unusual symptoms, such as:  Mild contractions of the uterus or cramps in the abdomen.  Dizziness that does not go away when you rest. Stop exercising and get help right away if: You have any unusual symptoms, such as:  Sudden, severe pain in your low back or your belly.  Mild contractions of the uterus or cramps in the abdomen that do not improve with rest and drinking fluids.  Chest pain.  Bleeding or fluid leaking from your vagina.  Shortness of breath. These symptoms may represent a serious problem that is an emergency. Do not wait to see if the symptoms will go away. Get medical help right away. Call your local emergency services (911 in the [place] Do not drive yourself to the hospital. Summary  Most women should exercise regularly throughout pregnancy. In rare cases, women with certain medical conditions or complications may be asked to limit or avoid exercise during pregnancy.  Do not exercise to lose weight during pregnancy.  Your health care provider will tell you what level of physical activity is right for you.  Stop exercising and contact a health care provider if you have mild contractions of the uterus or cramps in the abdomen. Get help right away if these contractions or cramps do not improve with rest and drinking fluids.  Stop exercising and get help right away if you have sudden, severe pain in your low back or belly, chest pain, shortness of breath, or bleeding or leaking of fluid from your vagina. This information is not intended to replace advice given to you by your health care provider. Make sure you discuss any questions you have with your  health care provider. Document Revised: 04/23/2018 Document Reviewed: 02/04/2018 Elsevier Patient Education  2020 [employer] Eating Plan for Pregnant Women While you are pregnant, your body requires additional nutrition to help support your growing baby. You also have a higher need for some vitamins and minerals, such as folic acid, calcium, iron, and vitamin D. Eating a healthy, well-balanced diet is very important for your health and your baby's health. Your need for extra calories varies for the three 3-month segments of your pregnancy (trimesters). For most women, it is recommended to consume:  150 extra calories a day during the first trimester.  300 extra calories a day during the second trimester.  300 extra calories a day during the third trimester. What are tips for following this plan?   Do not try to lose weight or go on a diet during pregnancy.  Limit your overall intake of foods that have "empty calories." These are foods that have little nutritional value, such as sweets, desserts, candies, and sugar-sweetened beverages.  Eat a variety of foods (especially fruits and vegetables) to get a full range of vitamins and minerals.  Take a prenatal vitamin to help meet your additional vitamin and mineral needs   during pregnancy, specifically for folic acid, iron, calcium, and vitamin D.  Remember to stay active. Ask your health care provider what types of exercise and activities are safe for you.  Practice good food safety and cleanliness. Wash your hands before you eat and after you prepare raw meat. Wash all fruits and vegetables well before peeling or eating. Taking these actions can help to prevent food-borne illnesses that can be very dangerous to your baby, such as listeriosis. Ask your health care provider for more information about listeriosis. What does 150 extra calories look like? Healthy options that provide 150 extra calories each day could be any of the  following:  6-8 oz (170-230 g) of plain low-fat yogurt with  cup of berries.  1 apple with 2 teaspoons (11 g) of peanut butter.  Cut-up vegetables with  cup (60 g) of hummus.  8 oz (230 mL) or 1 cup of low-fat chocolate milk.  1 stick of string cheese with 1 medium orange.  1 peanut butter and jelly sandwich that is made with one slice of whole-wheat bread and 1 tsp (5 g) of peanut butter. For 300 extra calories, you could eat two of those healthy options each day. What is a healthy amount of weight to gain? The right amount of weight gain for you is based on your BMI before you became pregnant. If your BMI:  Was less than 18 (underweight), you should gain 28-40 lb (13-18 kg).  Was 18-24.9 (normal), you should gain 25-35 lb (11-16 kg).  Was 25-29.9 (overweight), you should gain 15-25 lb (7-11 kg).  Was 30 or greater (obese), you should gain 11-20 lb (5-9 kg). What if I am having twins or multiples? Generally, if you are carrying twins or multiples:  You may need to eat 300-600 extra calories a day.  The recommended range for total weight gain is 25-54 lb (11-25 kg), depending on your BMI before pregnancy.  Talk with your health care provider to find out about nutritional needs, weight gain, and exercise that is right for you. What foods can I eat?  Fruits All fruits. Eat a variety of colors and types of fruit. Remember to wash your fruits well before peeling or eating. Vegetables All vegetables. Eat a variety of colors and types of vegetables. Remember to wash your vegetables well before peeling or eating. Grains All grains. Choose whole grains, such as whole-wheat bread, oatmeal, or brown rice. Meats and other protein foods Lean meats, including chicken, turkey, fish, and lean cuts of beef, veal, or pork. If you eat fish or seafood, choose options that are higher in omega-3 fatty acids and lower in mercury, such as salmon, herring, mussels, trout, sardines, pollock,  shrimp, crab, and lobster. Tofu. Tempeh. Beans. Eggs. Peanut butter and other nut butters. Make sure that all meats, poultry, and eggs are cooked to food-safe temperatures or "well-done." Two or more servings of fish are recommended each week in order to get the most benefits from omega-3 fatty acids that are found in seafood. Choose fish that are lower in mercury. You can find more information online:  www.fda.gov Dairy Pasteurized milk and milk alternatives (such as almond milk). Pasteurized yogurt and pasteurized cheese. Cottage cheese. Sour cream. Beverages Water. Juices that contain 100% fruit juice or vegetable juice. Caffeine-free teas and decaffeinated coffee. Drinks that contain caffeine are okay to drink, but it is better to avoid caffeine. Keep your total caffeine intake to less than 200 mg each day (which is 12 oz   or 355 mL of coffee, tea, or soda) or the limit as told by your health care provider. Fats and oils Fats and oils are okay to include in moderation. Sweets and desserts Sweets and desserts are okay to include in moderation. Seasoning and other foods All pasteurized condiments. The items listed above may not be a complete list of foods and beverages you can eat. Contact a dietitian for more information. What foods are not recommended? Fruits Unpasteurized fruit juices. Vegetables Raw (unpasteurized) vegetable juices. Meats and other protein foods Lunch meats, bologna, hot dogs, or other deli meats. (If you must eat those meats, reheat them until they are steaming hot.) Refrigerated pat, meat spreads from a meat counter, smoked seafood that is found in the refrigerated section of a store. Raw or undercooked meats, poultry, and eggs. Raw fish, such as sushi or sashimi. Fish that have high mercury content, such as tilefish, shark, swordfish, and king mackerel. To learn more about mercury in fish, talk with your health care provider or look for online resources, such  as:  www.fda.gov Dairy Raw (unpasteurized) milk and any foods that have raw milk in them. Soft cheeses, such as feta, queso blanco, queso fresco, Brie, Camembert cheeses, blue-veined cheeses, and Panela cheese (unless it is made with pasteurized milk, which must be stated on the label). Beverages Alcohol. Sugar-sweetened beverages, such as sodas, teas, or energy drinks. Seasoning and other foods Homemade fermented foods and drinks, such as pickles, sauerkraut, or kombucha drinks. (Store-bought pasteurized versions of these are okay.) Salads that are made in a store or deli, such as ham salad, chicken salad, egg salad, tuna salad, and seafood salad. The items listed above may not be a complete list of foods and beverages you should avoid. Contact a dietitian for more information. Where to find more information To calculate the number of calories you need based on your height, weight, and activity level, you can use an online calculator such as:  www.choosemyplate.gov/MyPlatePlan To calculate how much weight you should gain during pregnancy, you can use an online pregnancy weight gain calculator such as:  www.choosemyplate.gov/pregnancy-weight-gain-calculator Summary  While you are pregnant, your body requires additional nutrition to help support your growing baby.  Eat a variety of foods, especially fruits and vegetables to get a full range of vitamins and minerals.  Practice good food safety and cleanliness. Wash your hands before you eat and after you prepare raw meat. Wash all fruits and vegetables well before peeling or eating. Taking these actions can help to prevent food-borne illnesses, such as listeriosis, that can be very dangerous to your baby.  Do not eat raw meat or fish. Do not eat fish that have high mercury content, such as tilefish, shark, swordfish, and king mackerel. Do not eat unpasteurized (raw) dairy.  Take a prenatal vitamin to help meet your additional vitamin and  mineral needs during pregnancy, specifically for folic acid, iron, calcium, and vitamin D. This information is not intended to replace advice given to you by your health care provider. Make sure you discuss any questions you have with your health care provider. Document Revised: 05/21/2018 Document Reviewed: 09/27/2016 Elsevier Patient Education  2020 [employer] Inc. Prenatal Care Prenatal care is health care during pregnancy. It helps you and your unborn baby (fetus) stay as healthy as possible. Prenatal care may be provided by a midwife, a family practice health care provider, or a childbirth and pregnancy specialist (obstetrician). How does this affect me? During pregnancy, you will be closely monitored   for any new conditions that might develop. To lower your risk of pregnancy complications, you and your health care provider will talk about any underlying conditions you have. How does this affect my baby? Early and consistent prenatal care increases the chance that your baby will be healthy during pregnancy. Prenatal care lowers the risk that your baby will be:  Born early (prematurely).  Smaller than expected at birth (small for gestational age). What can I expect at the first prenatal care visit? Your first prenatal care visit will likely be the longest. You should schedule your first prenatal care visit as soon as you know that you are pregnant. Your first visit is a good time to talk about any questions or concerns you have about pregnancy. At your visit, you and your health care provider will talk about:  Your medical history, including: ? Any past pregnancies. ? Your family's medical history. ? The baby's father's medical history. ? Any long-term (chronic) health conditions you have and how you manage them. ? Any surgeries or procedures you have had. ? Any current over-the-counter or prescription medicines, herbs, or supplements you are taking.  Other factors that could pose a risk  to your baby, including:  Your home setting and your stress levels, including: ? Exposure to abuse or violence. ? Household financial strain. ? Mental health conditions you have.  Your daily health habits, including diet and exercise. Your health care provider will also:  Measure your weight, height, and blood pressure.  Do a physical exam, including a pelvic and breast exam.  Perform blood tests and urine tests to check for: ? Urinary tract infection. ? Sexually transmitted infections (STIs). ? Low iron levels in your blood (anemia). ? Blood type and certain proteins on red blood cells (Rh antibodies). ? Infections and immunity to viruses, such as hepatitis B and rubella. ? HIV (human immunodeficiency virus).  Do an ultrasound to confirm your baby's growth and development and to help predict your estimated due date (EDD). This ultrasound is done with a probe that is inserted into the vagina (transvaginal ultrasound).  Discuss your options for genetic screening.  Give you information about how to keep yourself and your baby healthy, including: ? Nutrition and taking vitamins. ? Physical activity. ? How to manage pregnancy symptoms such as nausea and vomiting (morning sickness). ? Infections and substances that may be harmful to your baby and how to avoid them. ? Food safety. ? Dental care. ? Working. ? Travel. ? Warning signs to watch for and when to call your health care provider. How often will I have prenatal care visits? After your first prenatal care visit, you will have regular visits throughout your pregnancy. The visit schedule is often as follows:  Up to week 28 of pregnancy: once every 4 weeks.  28-36 weeks: once every 2 weeks.  After 36 weeks: every week until delivery. Some women may have visits more or less often depending on any underlying health conditions and the health of the baby. Keep all follow-up and prenatal care visits as told by your health care  provider. This is important. What happens during routine prenatal care visits? Your health care provider will:  Measure your weight and blood pressure.  Check for fetal heart sounds.  Measure the height of your uterus in your abdomen (fundal height). This may be measured starting around week 20 of pregnancy.  Check the position of your baby inside your uterus.  Ask questions about your diet, sleeping patterns, and   whether you can feel the baby move.  Review warning signs to watch for and signs of labor.  Ask about any pregnancy symptoms you are having and how you are dealing with them. Symptoms may include: ? Headaches. ? Nausea and vomiting. ? Vaginal discharge. ? Swelling. ? Fatigue. ? Constipation. ? Any discomfort, including back or pelvic pain. Make a list of questions to ask your health care provider at your routine visits. What tests might I have during prenatal care visits? You may have blood, urine, and imaging tests throughout your pregnancy, such as:  Urine tests to check for glucose, protein, or signs of infection.  Glucose tests to check for a form of diabetes that can develop during pregnancy (gestational diabetes mellitus). This is usually done around week 24 of pregnancy.  An ultrasound to check your baby's growth and development and to check for birth defects. This is usually done around week 20 of pregnancy.  A test to check for group B strep (GBS) infection. This is usually done around week 36 of pregnancy.  Genetic testing. This may include blood or imaging tests, such as an ultrasound. Some genetic tests are done during the first trimester and some are done during the second trimester. What else can I expect during prenatal care visits? Your health care provider may recommend getting certain vaccines during pregnancy. These may include:  A yearly flu shot (annual influenza vaccine). This is especially important if you will be pregnant during flu  season.  Tdap (tetanus, diphtheria, pertussis) vaccine. Getting this vaccine during pregnancy can protect your baby from whooping cough (pertussis) after birth. This vaccine may be recommended between weeks 27 and 36 of pregnancy. Later in your pregnancy, your health care provider may give you information about:  Childbirth and breastfeeding classes.  Choosing a health care provider for your baby.  Umbilical cord banking.  Breastfeeding.  Birth control after your baby is born.  The hospital labor and delivery unit and how to tour it.  Registering at the hospital before you go into labor. Where to find more information  Office on Women's Health: womenshealth.gov  American Pregnancy Association: americanpregnancy.org  March of Dimes: marchofdimes.org Summary  Prenatal care helps you and your baby stay as healthy as possible during pregnancy.  Your first prenatal care visit will most likely be the longest.  You will have visits and tests throughout your pregnancy to monitor your health and your baby's health.  Bring a list of questions to your visits to ask your health care provider.  Make sure to keep all follow-up and prenatal care visits with your health care provider. This information is not intended to replace advice given to you by your health care provider. Make sure you discuss any questions you have with your health care provider. Document Revised: 04/22/2018 Document Reviewed: 12/30/2016 Elsevier Patient Education  2020 [employer] Inc.  

## 2019-08-11 ENCOUNTER — Other Ambulatory Visit: Payer: Self-pay | Admitting: Advanced Practice Midwife

## 2019-08-11 DIAGNOSIS — A5901 Trichomonal vulvovaginitis: Secondary | ICD-10-CM

## 2019-08-11 LAB — CERVICOVAGINAL ANCILLARY ONLY
Chlamydia: NEGATIVE
Comment: NEGATIVE
Comment: NEGATIVE
Comment: NORMAL
Neisseria Gonorrhea: NEGATIVE
Trichomonas: POSITIVE — AB

## 2019-08-11 MED ORDER — METRONIDAZOLE 500 MG PO TABS: 2000.0000 mg | ORAL_TABLET | Freq: Once | ORAL | 0 refills | Status: AC

## 2019-08-11 NOTE — Progress Notes (Signed)
Rx metronidazole 2g sent to patient pharmacy with instructions for treatment.

## 2019-08-12 ENCOUNTER — Telehealth: Payer: Self-pay

## 2019-08-12 LAB — URINE CULTURE

## 2019-08-12 NOTE — Telephone Encounter (Signed)
Pt left msg on triage saying she had questions of her test results. Called her back and she just wanted to know what did "normal reference range" meant if it said NEG. Explained normal ref range for test should be negative and hers was positive so its outside normal ref range.

## 2019-08-16 ENCOUNTER — Telehealth: Payer: Self-pay | Admitting: Obstetrics and Gynecology

## 2019-08-16 ENCOUNTER — Encounter: Payer: Medicaid Other | Admitting: Obstetrics and Gynecology

## 2019-08-16 NOTE — Telephone Encounter (Signed)
Attempted to reach patient about her appointment with [place] today. She does have appointments scheduled with [facility].

## 2019-08-19 ENCOUNTER — Ambulatory Visit: Payer: Medicaid Other | Admitting: Advanced Practice Midwife

## 2019-08-19 ENCOUNTER — Encounter: Payer: Self-pay | Admitting: Advanced Practice Midwife

## 2019-08-19 ENCOUNTER — Other Ambulatory Visit: Payer: Self-pay

## 2019-08-19 DIAGNOSIS — Z113 Encounter for screening for infections with a predominantly sexual mode of transmission: Secondary | ICD-10-CM | POA: Diagnosis not present

## 2019-08-19 LAB — WET PREP FOR TRICH, YEAST, CLUE
Trichomonas Exam: NEGATIVE
Yeast Exam: NEGATIVE

## 2019-08-19 NOTE — Progress Notes (Signed)
[place] results reviewed. Per standing orders no treatment indicated. Shakyla Nolley, RN  

## 2019-08-19 NOTE — Progress Notes (Signed)
[facility] Department STI clinic/screening visit  Subjective:  Tracey Christensen is a 29 y.o.SBF G4P3 nonsmoker  female being seen today for an STI screening visit. The patient reports they do not have symptoms.  Patient reports that they do desire a pregnancy in the next year.   They reported they are not interested in discussing contraception today because she is pregnant and has had 2 prenatal appts with [facility] already.   Patient's last menstrual period was 06/24/2019 (exact date).   Patient has the following medical conditions:   Patient Active Problem List   Diagnosis Date Noted  . Morbid obesity (HCC) 232 lbs 08/19/2019  . Supervision of high risk pregnancy in first trimester 08/10/2019  . History of pre-eclampsia in prior pregnancy, currently pregnant 05/28/2017    No chief complaint on file.   HPI  Patient reports LMP 06/24/19.  Last sex 08/15/19 without condom; with current partner x 1 year.  Last ETOH 07/18/19 (1 glass wine) "rarely". Last pap 02/24/17 neg.  Nexplanon removed in June.   Last HIV test per patient/review of record was 01/26/19 Patient reports last pap was 02/24/17 neg  See flowsheet for further details and programmatic requirements.    The following portions of the patient's history were reviewed and updated as appropriate: allergies, current medications, past medical history, past social history, past surgical history and problem list.  Objective:  There were no vitals filed for this visit.  Physical Exam Vitals and nursing note reviewed.  Constitutional:      Appearance: Normal appearance. She is obese.  HENT:     Head: Normocephalic and atraumatic.     Mouth/Throat:     Mouth: Mucous membranes are moist.     Pharynx: Oropharynx is clear. No oropharyngeal exudate or posterior oropharyngeal erythema.  Eyes:     Conjunctiva/sclera: Conjunctivae normal.  Pulmonary:     Effort: Pulmonary effort is normal.  Abdominal:     Palpations: Abdomen is  soft. There is no mass.     Tenderness: There is no abdominal tenderness. There is no rebound.     Comments: Soft without tenderness, poor tone, increased adipose  Genitourinary:    General: Normal vulva.     Exam position: Lithotomy position.     Pubic Area: No rash or pubic lice.      Labia:        Right: No rash or lesion.        Left: No rash or lesion.      Vagina: Vaginal discharge (white creamy leukorrhea, ph<4.5) present. No erythema, bleeding or lesions.     Cervix: Normal.     Uterus: Enlarged (8 wks enlarged).      Rectum: Normal.  Lymphadenopathy:     Head:     Right side of head: No preauricular or posterior auricular adenopathy.     Left side of head: No preauricular or posterior auricular adenopathy.     Cervical: No cervical adenopathy.     Upper Body:     Right upper body: No supraclavicular or axillary adenopathy.     Left upper body: No supraclavicular or axillary adenopathy.     Lower Body: No right inguinal adenopathy. No left inguinal adenopathy.  Skin:    General: Skin is warm and dry.     Findings: No rash.  Neurological:     Mental Status: She is alert and oriented to person, place, and time.      Assessment and Plan:  Tracey Lufkin  Christensen is a 29 y.o. female presenting to the [facility] Department for STI screening  1. Morbid obesity (HCC) 232 lbs   2. Screening examination for venereal disease Treat wet mount per standing orders Immunization nurse consult - WET PREP FOR TRICH, YEAST, CLUE - Chlamydia/Gonorrhea [facility]     No follow-ups on file.  Future Appointments  Date Time Provider Department Center  08/23/2019 10:40 AM [facility] None  08/23/2019 11:00 AM [facility] [place] 2 [facility] None  08/23/2019 11:30 AM Vena Austria, MD [facility] None    Tracey Christensen, CNM

## 2019-08-23 ENCOUNTER — Other Ambulatory Visit: Payer: Medicaid Other

## 2019-08-23 ENCOUNTER — Ambulatory Visit (INDEPENDENT_AMBULATORY_CARE_PROVIDER_SITE_OTHER): Payer: Medicaid Other

## 2019-08-23 ENCOUNTER — Other Ambulatory Visit: Payer: Self-pay | Admitting: Advanced Practice Midwife

## 2019-08-23 ENCOUNTER — Other Ambulatory Visit: Payer: Self-pay

## 2019-08-23 ENCOUNTER — Encounter: Payer: Self-pay | Admitting: Obstetrics and Gynecology

## 2019-08-23 ENCOUNTER — Ambulatory Visit (INDEPENDENT_AMBULATORY_CARE_PROVIDER_SITE_OTHER): Payer: Medicaid Other | Admitting: Obstetrics and Gynecology

## 2019-08-23 VITALS — BP 110/76 | Wt 232.0 lb

## 2019-08-23 DIAGNOSIS — O0991 Supervision of high risk pregnancy, unspecified, first trimester: Secondary | ICD-10-CM | POA: Diagnosis not present

## 2019-08-23 DIAGNOSIS — Z3A08 8 weeks gestation of pregnancy: Secondary | ICD-10-CM | POA: Diagnosis not present

## 2019-08-23 DIAGNOSIS — O99211 Obesity complicating pregnancy, first trimester: Secondary | ICD-10-CM

## 2019-08-23 DIAGNOSIS — O09299 Supervision of pregnancy with other poor reproductive or obstetric history, unspecified trimester: Secondary | ICD-10-CM

## 2019-08-23 DIAGNOSIS — Z113 Encounter for screening for infections with a predominantly sexual mode of transmission: Secondary | ICD-10-CM

## 2019-08-23 DIAGNOSIS — O09291 Supervision of pregnancy with other poor reproductive or obstetric history, first trimester: Secondary | ICD-10-CM

## 2019-08-23 NOTE — Progress Notes (Signed)
Routine Prenatal Care Visit  Subjective  Tracey Christensen is a 29 y.o. [phone] at [redacted]w[redacted]d being seen today for ongoing prenatal care.  She is currently monitored for the following issues for this high-risk pregnancy and has History of pre-eclampsia in prior pregnancy, currently pregnant; Supervision of high risk pregnancy in first trimester; and Morbid obesity (HCC) 232 lbs on their problem list.  ----------------------------------------------------------------------------------- Patient reports no complaints.   Contractions: Not present. Vag. Bleeding: None.  Movement: Absent. Denies leaking of fluid.  ----------------------------------------------------------------------------------- The following portions of the patient's history were reviewed and updated as appropriate: allergies, current medications, past family history, past medical history, past social history, past surgical history and problem list. Problem list updated.   Objective  Blood pressure 110/76, weight 232 lb (105.2 kg), last menstrual period 06/24/2019, unknown if currently breastfeeding. Pregravid weight 228 lb (103.4 kg) Total Weight Gain 4 lb (1.814 kg) Urinalysis:      Fetal Status: Fetal Heart Rate (bpm): prese   Movement: Absent     General:  Alert, oriented and cooperative. Patient is in no acute distress.  Skin: Skin is warm and dry. No rash noted.   Cardiovascular: Normal heart rate noted  Respiratory: Normal respiratory effort, no problems with respiration noted  Abdomen: Soft, gravid, appropriate for gestational age. Pain/Pressure: Absent     Pelvic:  Cervical exam deferred        Extremities: Normal range of motion.     ental Status: Normal mood and affect. Normal behavior. Normal judgment and thought content.   US OB LESS THAN 14 WEEKS WITH OB TRANSVAGINAL  Result Date: 08/23/2019 Patient Name: Tracey Christensen DOB: [birth year] MRN: [id number] ULTRASOUND REPORT Location: [facility] Date of Service:  08/23/2019 Indications:dating Findings: Mason Jim intrauterine pregnancy is visualized with a CRL consistent with [redacted]w[redacted]d gestation, giving an (U/S) EDD of 03/30/2020. The (U/S) EDD is consistent with the clinically established EDD of 03/30/2020. FHR: 176 BPM CRL measurement: 20.4 mm Yolk sac is visualized and appears normal. Amnion: visualized and appears normal Right Ovary is normal in appearance. Left Ovary is normal appearance. Corpus luteal cyst:  Left ovary Survey of the adnexa demonstrates no adnexal masses. There is no free peritoneal fluid in the cul de sac. Impression: 1. [redacted]w[redacted]d Viable Singleton Intrauterine pregnancy by U/S. 2. (U/S) EDD is consistent with Clinically established EDD of 03/30/2020. Recommendations: 1.Clinical correlation with the patient's History and Physical Exam. Deanna Artis, RT There is a viable singleton gestation.  The fetal biometry correlates with established dating. Detailed evaluation of the fetal anatomy is precluded by early gestational age.  It must be noted that a normal ultrasound particular at this early gestational age is unable to rule out fetal aneuploidy, risk of first trimester miscarriage, or anatomic birth defects. Vena Austria, MD, Ever[facility], [facility] 08/23/2019, 12:10 PM     Assessment   29 y.o. [id number] at [redacted]w[redacted]d by  03/30/2020, by Last Menstrual Period presenting for routine prenatal visit  Plan   pregnancy Problems (from 08/10/19 to 08/19/19)    Problem Noted Resolved   Supervision of high risk pregnancy in first trimester 08/10/2019 by Tresea Mall, CNM No   Overview Signed 08/10/2019 10:13 AM by Tresea Mall, CNM    Clinic [facility] Prenatal Labs  Dating LMP = [redacted]w[redacted]d [place] Blood type:     Genetic Screen 1 Screen:    AFP:     Quad:     NIPS: Antibody:   Anatomic [place]  Rubella:    Varicella: @VZVIGG @  GTT Early:                Third trimester:  RPR:     Rhogam  HBsAg:     Vaccines TDAP:                       Flu Shot:  HIV:     Baby Food Formula                               GBS:   GC/CT:  Contraception  Pap: 2019 negative  CBB     CS/VBAC NA   Support Person 2020          Obesity complicating pregnancy in first trimester 05/12/2019 by 05/14/2019, CNM 08/19/2019 by 10/19/2019, CNM       Gestational age appropriate obstetric precautions including but not limited to vaginal bleeding, contractions, leaking of fluid and fetal movement were reviewed in detail with the patient.     Discussed COVID vaccination in pregnancy  Return in about 5 weeks (around 09/30/2019) for ROB and genetics labs.  10/02/2019, MD, Vena Austria [facility], [facility] 08/23/2019, 12:21 PM

## 2019-08-23 NOTE — Progress Notes (Signed)
ROB  Dating scan today 

## 2019-08-25 LAB — RPR+RH+ABO+RUB AB+AB SCR+CB...
Antibody Screen: NEGATIVE
HIV Screen 4th Generation wRfx: NONREACTIVE
Hematocrit: 39.2 % (ref 34.0–46.6)
Hemoglobin: 12.5 g/dL (ref 11.1–15.9)
Hepatitis B Surface Ag: NEGATIVE
MCH: 26.1 pg — ABNORMAL LOW (ref 26.6–33.0)
MCHC: 31.9 g/dL (ref 31.5–35.7)
MCV: 82 fL (ref 79–97)
Platelets: 293 10*3/uL (ref 150–450)
RBC: 4.79 x10E6/uL (ref 3.77–5.28)
RDW: 13.1 % (ref 11.7–15.4)
RPR Ser Ql: NONREACTIVE
Rh Factor: POSITIVE
Rubella Antibodies, IGG: 2.14 index (ref >0.99)
Varicella zoster IgG: 1147 index (ref >165)
WBC: 9 10*3/uL (ref 3.4–10.8)

## 2019-08-25 LAB — HGB FRACTIONATION CASCADE
Hgb A2: 2.7 % (ref 1.8–3.2)
Hgb A: 97.3 % (ref 96.4–98.8)
Hgb F: 0 % (ref 0.0–2.0)
Hgb S: 0 %

## 2019-08-25 LAB — GLUCOSE, 1 HOUR GESTATIONAL: Gestational Diabetes Screen: 81 mg/dL (ref 65–139)

## 2019-09-02 ENCOUNTER — Other Ambulatory Visit: Payer: Medicaid Other

## 2019-09-02 ENCOUNTER — Other Ambulatory Visit: Payer: Self-pay

## 2019-09-02 DIAGNOSIS — O09299 Supervision of pregnancy with other poor reproductive or obstetric history, unspecified trimester: Secondary | ICD-10-CM

## 2019-09-02 DIAGNOSIS — Z3A10 10 weeks gestation of pregnancy: Secondary | ICD-10-CM

## 2019-09-02 DIAGNOSIS — Z1379 Encounter for other screening for genetic and chromosomal anomalies: Secondary | ICD-10-CM

## 2019-09-02 DIAGNOSIS — O0991 Supervision of high risk pregnancy, unspecified, first trimester: Secondary | ICD-10-CM

## 2019-09-03 LAB — CMP14+EGFR
ALT: 8 IU/L (ref 0–32)
AST: 9 IU/L (ref 0–40)
Albumin/Globulin Ratio: 1.3 (ref 1.2–2.2)
Albumin: 3.9 g/dL (ref 3.9–5.0)
Alkaline Phosphatase: 59 IU/L (ref 48–121)
BUN/Creatinine Ratio: 11 (ref 9–23)
BUN: 6 mg/dL (ref 6–20)
Bilirubin Total: 0.2 mg/dL (ref 0.0–1.2)
CO2: 22 mmol/L (ref 20–29)
Calcium: 9 mg/dL (ref 8.7–10.2)
Chloride: 103 mmol/L (ref 96–106)
Creatinine, Ser: 0.55 mg/dL — ABNORMAL LOW (ref 0.57–1.00)
GFR calc Af Amer: 147 mL/min/{1.73_m2} (ref >59)
GFR calc non Af Amer: 127 mL/min/{1.73_m2} (ref >59)
Globulin, Total: 3.1 g/dL (ref 1.5–4.5)
Glucose: 86 mg/dL (ref 65–99)
Potassium: 4.4 mmol/L (ref 3.5–5.2)
Sodium: 138 mmol/L (ref 134–144)
Total Protein: 7 g/dL (ref 6.0–8.5)

## 2019-09-06 ENCOUNTER — Telehealth: Payer: Self-pay

## 2019-09-06 NOTE — Telephone Encounter (Signed)
Spoke w/patient. Advised spotting can be normal in early pregnancy. Monitor/report bright red blood or if associated w/cramping that is doubling her over.

## 2019-09-06 NOTE — Telephone Encounter (Signed)
Patient reports she had some spotting on Friday and would like to speak to a nurse. Cb# [phone]

## 2019-09-07 ENCOUNTER — Other Ambulatory Visit: Payer: Self-pay

## 2019-09-07 ENCOUNTER — Encounter: Payer: Self-pay | Admitting: Advanced Practice Midwife

## 2019-09-07 ENCOUNTER — Ambulatory Visit (INDEPENDENT_AMBULATORY_CARE_PROVIDER_SITE_OTHER): Payer: Medicaid Other | Admitting: Advanced Practice Midwife

## 2019-09-07 VITALS — BP 120/70 | Wt 231.0 lb

## 2019-09-07 DIAGNOSIS — O0991 Supervision of high risk pregnancy, unspecified, first trimester: Secondary | ICD-10-CM

## 2019-09-07 DIAGNOSIS — O09291 Supervision of pregnancy with other poor reproductive or obstetric history, first trimester: Secondary | ICD-10-CM

## 2019-09-07 DIAGNOSIS — O209 Hemorrhage in early pregnancy, unspecified: Secondary | ICD-10-CM

## 2019-09-07 DIAGNOSIS — O09299 Supervision of pregnancy with other poor reproductive or obstetric history, unspecified trimester: Secondary | ICD-10-CM

## 2019-09-07 DIAGNOSIS — Z3A10 10 weeks gestation of pregnancy: Secondary | ICD-10-CM

## 2019-09-07 LAB — MATERNIT 21 PLUS CORE, BLOOD
Fetal Fraction: 6
Result (T21): NEGATIVE
Trisomy 13 (Patau syndrome): NEGATIVE
Trisomy 18 (Edwards syndrome): NEGATIVE
Trisomy 21 (Down syndrome): NEGATIVE

## 2019-09-07 NOTE — Progress Notes (Signed)
Routine Prenatal Care Visit  Subjective  Tracey Christensen is a 30 y.o. [phone] at [redacted]w[redacted]d being seen today for ongoing prenatal care.  She is currently monitored for the following issues for this high-risk pregnancy and has History of pre-eclampsia in prior pregnancy, currently pregnant; Supervision of high risk pregnancy in first trimester; and Morbid obesity (HCC) 232 lbs on their problem list.  ----------------------------------------------------------------------------------- Patient reports pink spotting when wiping on Friday and again on Monday. No spotting over the weekend. She did have intercourse prior to spotting. She denies cramping or bright red blood.  She declines cervical exam. Patient is reassured with bedside ultrasound.  . Vag. Bleeding: Small.  Movement: Present. Leaking Fluid denies.  ----------------------------------------------------------------------------------- The following portions of the patient's history were reviewed and updated as appropriate: allergies, current medications, past family history, past medical history, past social history, past surgical history and problem list. Problem list updated.  Objective  Blood pressure 120/70, weight 231 lb (104.8 kg), last menstrual period 06/24/2019 Pregravid weight 228 lb (103.4 kg) Total Weight Gain 3 lb (1.361 kg) Urinalysis: Urine Protein    Urine Glucose    Fetal Status: Fetal Heart Rate (bpm): present   Movement: Present      Bedside ultrasound: + cardiac activity, + movement  General:  Alert, oriented and cooperative. Patient is in no acute distress.  Skin: Skin is warm and dry. No rash noted.   Cardiovascular: Normal heart rate noted  Respiratory: Normal respiratory effort, no problems with respiration noted  Abdomen: Soft, gravid, appropriate for gestational age.       Pelvic:  Cervical exam deferred        Extremities: Normal range of motion.     Mental Status: Normal mood and affect. Normal behavior. Normal  judgment and thought content.   Assessment   29 y.o. [id number] at [redacted]w[redacted]d by  03/30/2020, by Last Menstrual Period presenting for work-in prenatal visit  Plan   pregnancy Problems (from 08/10/19 to present)    Problem Noted Resolved   Supervision of high risk pregnancy in first trimester 08/10/2019 by Tresea Mall, CNM No   Overview Addendum 08/23/2019  8:59 PM by Vena Austria, MD    Clinic [facility] Prenatal Labs  Dating LMP = [redacted]w[redacted]d [place] Blood type:     Genetic Screen 1 Screen:    AFP:     Quad:     NIPS: Antibody:   Anatomic [place]  Rubella:    Varicella: @VZVIGG @  GTT Early:                Third trimester:  RPR:     Rhogam  HBsAg:     Vaccines TDAP:                       Flu Shot: HIV:     Baby Food Formula                               GBS:   GC/CT:  Contraception  Pap: 2019 negative  CBB     CS/VBAC NA   Support Person 2020          Previous Version   Obesity complicating pregnancy in first trimester 05/12/2019 by 05/14/2019, CNM 08/19/2019 by 10/19/2019, CNM       Preterm labor symptoms and general obstetric precautions including but not limited to vaginal bleeding, contractions, leaking of fluid and fetal movement  were reviewed in detail with the patient.   Return for scheduled follow up.  Tresea Mall, CNM 09/07/2019 3:30 PM

## 2019-09-16 ENCOUNTER — Ambulatory Visit: Payer: Medicaid Other | Attending: Family Medicine

## 2019-09-16 ENCOUNTER — Encounter: Payer: Self-pay | Admitting: Emergency Medicine

## 2019-09-16 ENCOUNTER — Ambulatory Visit
Admission: EM | Admit: 2019-09-16 | Discharge: 2019-09-16 | Disposition: A | Discharge status: Home / Self Care | Payer: Medicaid Other | Attending: Family Medicine | Admitting: Family Medicine

## 2019-09-16 ENCOUNTER — Other Ambulatory Visit: Payer: Self-pay

## 2019-09-16 DIAGNOSIS — Z3A12 12 weeks gestation of pregnancy: Secondary | ICD-10-CM

## 2019-09-16 DIAGNOSIS — M25511 Pain in right shoulder: Secondary | ICD-10-CM

## 2019-09-16 DIAGNOSIS — M546 Pain in thoracic spine: Secondary | ICD-10-CM | POA: Diagnosis not present
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
## 2019-09-23 ENCOUNTER — Other Ambulatory Visit: Payer: Self-pay

## 2019-09-23 ENCOUNTER — Encounter: Payer: Self-pay | Admitting: Emergency Medicine

## 2019-09-23 ENCOUNTER — Ambulatory Visit
Admission: EM | Admit: 2019-09-23 | Discharge: 2019-09-23 | Disposition: A | Discharge status: Home / Self Care | Payer: Medicaid Other | Attending: Physician Assistant | Admitting: Physician Assistant

## 2019-09-23 DIAGNOSIS — Z20822 Contact with and (suspected) exposure to covid-19: Secondary | ICD-10-CM

## 2019-09-23 NOTE — ED Triage Notes (Signed)
Pt presents to [facility] for covid testing. She states she is not having symptoms.

## 2019-09-23 NOTE — Discharge Instructions (Signed)

## 2019-09-24 LAB — SARS CORONAVIRUS 2 (TAT 6-24 HRS): SARS Coronavirus 2: NEGATIVE

## 2019-09-27 ENCOUNTER — Other Ambulatory Visit: Payer: Self-pay

## 2019-09-27 ENCOUNTER — Encounter: Payer: Self-pay | Admitting: Obstetrics and Gynecology

## 2019-09-27 ENCOUNTER — Ambulatory Visit (INDEPENDENT_AMBULATORY_CARE_PROVIDER_SITE_OTHER): Payer: Medicaid Other | Admitting: Obstetrics and Gynecology

## 2019-09-27 VITALS — BP 122/74 | Wt 231.0 lb

## 2019-09-27 DIAGNOSIS — Z3A13 13 weeks gestation of pregnancy: Secondary | ICD-10-CM

## 2019-09-27 DIAGNOSIS — O09299 Supervision of pregnancy with other poor reproductive or obstetric history, unspecified trimester: Secondary | ICD-10-CM

## 2019-09-27 DIAGNOSIS — O0991 Supervision of high risk pregnancy, unspecified, first trimester: Secondary | ICD-10-CM

## 2019-09-27 DIAGNOSIS — O99210 Obesity complicating pregnancy, unspecified trimester: Secondary | ICD-10-CM | POA: Insufficient documentation

## 2019-09-27 DIAGNOSIS — O99211 Obesity complicating pregnancy, first trimester: Secondary | ICD-10-CM
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
## 2019-10-25 ENCOUNTER — Other Ambulatory Visit: Payer: Self-pay

## 2019-10-25 ENCOUNTER — Ambulatory Visit (INDEPENDENT_AMBULATORY_CARE_PROVIDER_SITE_OTHER): Payer: Medicaid Other | Admitting: Obstetrics and Gynecology

## 2019-10-25 VITALS — BP 124/84 | Wt 231.0 lb

## 2019-10-25 DIAGNOSIS — O99212 Obesity complicating pregnancy, second trimester: Secondary | ICD-10-CM

## 2019-10-25 DIAGNOSIS — O09299 Supervision of pregnancy with other poor reproductive or obstetric history, unspecified trimester: Secondary | ICD-10-CM

## 2019-10-25 DIAGNOSIS — Z363 Encounter for antenatal screening for malformations: Secondary | ICD-10-CM

## 2019-10-25 DIAGNOSIS — O0991 Supervision of high risk pregnancy, unspecified, first trimester: Secondary | ICD-10-CM

## 2019-10-25 LAB — POCT URINALYSIS DIPSTICK OB
Glucose, UA: NEGATIVE
POC,PROTEIN,UA: NEGATIVE

## 2019-10-25 NOTE — Progress Notes (Signed)
Routine Prenatal Care Visit  Subjective  Tracey Christensen is a 29 y.o. [phone] at [redacted]w[redacted]d being seen today for ongoing prenatal care.  She is currently monitored for the following issues for this low-risk pregnancy and has History of pre-eclampsia in prior pregnancy, currently pregnant; Supervision of high risk pregnancy in first trimester; Morbid obesity (HCC) 232 lbs; and Obesity affecting pregnancy on their problem list.  ----------------------------------------------------------------------------------- Patient reports no complaints.   Contractions: Not present. Vag. Bleeding: None.  Movement: Present. Denies leaking of fluid.  ----------------------------------------------------------------------------------- The following portions of the patient's history were reviewed and updated as appropriate: allergies, current medications, past family history, past medical history, past social history, past surgical history and problem list. Problem list updated.   Objective  Blood pressure 124/84, weight 231 lb (104.8 kg), last menstrual period 06/24/2019, unknown if currently breastfeeding. Pregravid weight 228 lb (103.4 kg) Total Weight Gain 3 lb (1.361 kg) Urinalysis:      Fetal Status: Fetal Heart Rate (bpm): 165   Movement: Present     General:  Alert, oriented and cooperative. Patient is in no acute distress.  Skin: Skin is warm and dry. No rash noted.   Cardiovascular: Normal heart rate noted  Respiratory: Normal respiratory effort, no problems with respiration noted  Abdomen: Soft, gravid, appropriate for gestational age. Pain/Pressure: Absent     Pelvic:  Cervical exam deferred        Extremities: Normal range of motion.     ental Status: Normal mood and affect. Normal behavior. Normal judgment and thought content.     Assessment   29 y.o. [id number] at [redacted]w[redacted]d by  03/30/2020, by Last Menstrual Period presenting for routine prenatal visit  Plan   pregnancy Problems (from 08/10/19  to present)    Problem Noted Resolved   Obesity affecting pregnancy 09/27/2019 by Conard Novak, MD No   Supervision of high risk pregnancy in first trimester 08/10/2019 by Tresea Mall, CNM No   Overview Addendum 08/23/2019  8:59 PM by Vena Austria, MD    [facility] Prenatal Labs  Dating LMP = [redacted]w[redacted]d [place] Blood type:     Genetic Screen NIPS: XY Antibody:   Anatomic [place]  Rubella:    Varicella: @VZVIGG @  GTT Early:                Third trimester:  RPR:     Rhogam  HBsAg:     Vaccines TDAP:                       Flu Shot: HIV:     Baby Food Formula                               GBS:   GC/CT:  Contraception  Pap: 2019 negative  CBB     CS/VBAC NA   Support Person 2020          Previous Version   Obesity complicating pregnancy in first trimester 05/12/2019 by 05/14/2019, CNM 08/19/2019 by 10/19/2019, CNM       Gestational age appropriate obstetric precautions including but not limited to vaginal bleeding, contractions, leaking of fluid and fetal movement were reviewed in detail with the patient.    1) History of preeclampsia - Start low dose ASA at >[redacted] weeks gestation as per USPTF recommendation "Low-Dose Aspirin Use for the Prevention of Morbidity and Mortality From Preeclampsia: Preventive  Medicine"  furthermore endorsed by ACOG, WHO, and NIH based on evidence level B for the prevention of preeclampsia  In women deemed high risk  (diabetes, renal disease, chronic hypertension, history of preeclampsia in prior gestation, autoimmune diseases, or multifetal gestations).  ACOG Committee Opinion 743 "Low-Dose Asprin Use During Pregnancy" June 25th 2018  High Risk (Start if 1 or more present) History of preeclampsia Multifetal Gestation Chronic HTN Type I or II DM Renal Disease Autoimmune Disease (SLE, Antiphospholipid antibody syndrome)  Moderate Risk (consider starting if more than one present) Nulliparity Obesity (BMI >30) Family history of  Preeclampsia (Mother or sister) Socioeconomic characteristics (African American, low socioeconomic status) Age 65 years or older Personal history factors (low birthweight of SGA, previous adverse pregnancy outcome, more than 10 year pregnancy interval)   Return in about 2 weeks (around 11/08/2019) for ROB and anatomy scan.  Vena Austria, MD, Ever[facility], [facility] 10/25/2019, 8:28 AM

## 2019-10-25 NOTE — Progress Notes (Signed)
Pt. Declined flu shot 

## 2019-11-08 ENCOUNTER — Telehealth: Payer: Self-pay | Admitting: Obstetrics & Gynecology

## 2019-11-08 ENCOUNTER — Ambulatory Visit (INDEPENDENT_AMBULATORY_CARE_PROVIDER_SITE_OTHER): Payer: Medicaid Other

## 2019-11-08 ENCOUNTER — Encounter: Payer: Self-pay | Admitting: Obstetrics & Gynecology

## 2019-11-08 ENCOUNTER — Other Ambulatory Visit: Payer: Self-pay

## 2019-11-08 ENCOUNTER — Other Ambulatory Visit: Payer: Self-pay | Admitting: Obstetrics and Gynecology

## 2019-11-08 ENCOUNTER — Ambulatory Visit (INDEPENDENT_AMBULATORY_CARE_PROVIDER_SITE_OTHER): Payer: Medicaid Other | Admitting: Obstetrics & Gynecology

## 2019-11-08 VITALS — Wt 231.0 lb

## 2019-11-08 DIAGNOSIS — O0991 Supervision of high risk pregnancy, unspecified, first trimester: Secondary | ICD-10-CM | POA: Diagnosis not present

## 2019-11-08 DIAGNOSIS — Z363 Encounter for antenatal screening for malformations: Secondary | ICD-10-CM

## 2019-11-08 DIAGNOSIS — O99212 Obesity complicating pregnancy, second trimester: Secondary | ICD-10-CM

## 2019-11-08 DIAGNOSIS — Z3A19 19 weeks gestation of pregnancy: Secondary | ICD-10-CM

## 2019-11-08 DIAGNOSIS — O09292 Supervision of pregnancy with other poor reproductive or obstetric history, second trimester: Secondary | ICD-10-CM

## 2019-11-08 NOTE — Telephone Encounter (Signed)
Patient is calling to follow up on FMLA paper work .Please patient states employer hasn't reviewed forms. Please refaxed to [phone] call patient

## 2019-11-08 NOTE — Progress Notes (Signed)
Virtual Visit via Telephone Note  I connected with patient on 11/08/19 at 11:20 AM EDT by telephone and verified that I am speaking with the correct person using two identifiers.   I discussed the limitations, risks, security and privacy concerns of performing an evaluation and management service by telephone and the availability of in person appointments. I also discussed with the patient that there may be a patient responsible charge related to this service. The patient expressed understanding and agreed to proceed.  The patient was at Home I spoke with the patient from my  Office  Tracey Christensen is a 29 y.o. [phone] at [redacted]w[redacted]d being seen today for ongoing prenatal care.  She is currently monitored for the following issues for this high-risk pregnancy and has History of pre-eclampsia in prior pregnancy, currently pregnant; Supervision of high risk pregnancy in first trimester; Morbid obesity (HCC) 232 lbs; and Obesity affecting pregnancy on their problem list.  ----------------------------------------------------------------------------------- Patient reports no complaints.   Denies pain, VB, leaking of fluid.  ----------------------------------------------------------------------------------- The following portions of the patient's history were reviewed and updated as appropriate: allergies, current medications, past family history, past medical history, past social history, past surgical history and problem list. Problem list updated.   Objective  Weight 231 lb (104.8 kg), last menstrual period 06/24/2019, unknown if currently breastfeeding. Pregravid weight 228 lb (103.4 kg) Total Weight Gain 3 lb (1.361 kg)  Physical Exam could not be performed. Because of the COVID-19 outbreak this visit was performed over the phone and not in person.   Assessment   29 y.o. [id number] at [redacted]w[redacted]d by  03/30/2020, by Last Menstrual Period presenting for routine prenatal visit  Plan   pregnancy Problems (from  08/10/19 to present)    Problem Noted Resolved   Obesity affecting pregnancy 09/27/2019 by Conard Novak, MD No   Supervision of high risk pregnancy in first trimester 08/10/2019 by Tresea Mall, CNM No   Overview Addendum 08/23/2019  8:59 PM by Vena Austria, MD    [facility] Prenatal Labs  Dating LMP = [redacted]w[redacted]d [place] Blood type:     Genetic Screen 1 Screen:    AFP:     Quad:     NIPS: Antibody:   Anatomic [place]  Rubella:    Varicella: @VZVIGG @  GTT Early:                Third trimester:  RPR:     Rhogam  HBsAg:     Vaccines TDAP:                       Flu Shot: HIV:     Baby Food Formula                               GBS:   GC/CT:  Contraception  Pap: 2019 negative  CBB     CS/VBAC NA   Support Person 2020            Gestational age appropriate obstetric precautions including but not limited to vaginal bleeding, contractions, leaking of fluid and fetal movement were reviewed in detail with the patient.     Follow Up Instructions: 4 weeks, w f/u Alla German as well   I discussed the assessment and treatment plan with the patient. The patient was provided an opportunity to ask questions and all were answered. The patient agreed with the plan and demonstrated an understanding of the  instructions.   The patient was advised to call back or seek an in-person evaluation if the symptoms worsen or if the condition fails to improve as anticipated.  I provided 10 minutes of non-face-to-face time during this encounter.  Return in about 4 weeks (around 12/06/2019) for ROB.  Annamarie Major, MD [facility], [facility] 11/08/2019 11:55 AM

## 2019-11-10 NOTE — Telephone Encounter (Signed)
Pt had wanted me to mail forms. They were mailed 10/19. Called pt today with no answer.

## 2019-12-01 ENCOUNTER — Telehealth: Payer: Self-pay

## 2019-12-01 NOTE — Telephone Encounter (Signed)
Pt calling; what can she take for common cold?  [phone]  Adv plain sudafed, e.s. tylenol, plain robitussin/musinex, hall's cough drops, gargle as many times a day as she wants with warm salt water, sip hot beverages/soup; push fluids and rest.

## 2019-12-06 ENCOUNTER — Ambulatory Visit (INDEPENDENT_AMBULATORY_CARE_PROVIDER_SITE_OTHER): Payer: Medicaid Other

## 2019-12-06 ENCOUNTER — Ambulatory Visit (INDEPENDENT_AMBULATORY_CARE_PROVIDER_SITE_OTHER): Payer: Medicaid Other | Admitting: Obstetrics & Gynecology

## 2019-12-06 ENCOUNTER — Encounter: Payer: Self-pay | Admitting: Obstetrics & Gynecology

## 2019-12-06 ENCOUNTER — Other Ambulatory Visit: Payer: Self-pay

## 2019-12-06 ENCOUNTER — Telehealth: Payer: Self-pay

## 2019-12-06 VITALS — BP 100/60 | Wt 230.0 lb

## 2019-12-06 DIAGNOSIS — O0992 Supervision of high risk pregnancy, unspecified, second trimester: Secondary | ICD-10-CM

## 2019-12-06 DIAGNOSIS — Z363 Encounter for antenatal screening for malformations: Secondary | ICD-10-CM

## 2019-12-06 DIAGNOSIS — O99212 Obesity complicating pregnancy, second trimester: Secondary | ICD-10-CM

## 2019-12-06 DIAGNOSIS — Z131 Encounter for screening for diabetes mellitus: Secondary | ICD-10-CM

## 2019-12-06 DIAGNOSIS — Z3A23 23 weeks gestation of pregnancy: Secondary | ICD-10-CM

## 2019-12-06 LAB — POCT URINALYSIS DIPSTICK OB: Glucose, UA: NEGATIVE

## 2019-12-06 NOTE — Telephone Encounter (Signed)
Pt calling triage requesting a letter for work to only work 4-5 hours a day because of swelling in her ankles. Please advise/write letter if this is okay.

## 2019-12-06 NOTE — Patient Instructions (Signed)

## 2019-12-06 NOTE — Progress Notes (Signed)
  Subjective  Fetal Movement? yes Contractions? no Leaking Fluid? no Vaginal Bleeding? no  Objective  BP 100/60   Wt 230 lb (104.3 kg)   LMP 06/24/2019 (Exact Date)   BMI 38.27 kg/m  General: NAD Pumonary: no increased work of breathing Abdomen: gravid, non-tender Extremities: no edema Psychiatric: mood appropriate, affect full  Assessment  29 y.o. [id number] at [redacted]w[redacted]d by  03/30/2020, by Last Menstrual Period presenting for routine prenatal visit  Plan   Problem List Items Addressed This Visit      Other   Supervision of high risk pregnancy in first trimester   Obesity affecting pregnancy    Other Visit Diagnoses    [redacted] weeks gestation of pregnancy    -  Primary   Screening for diabetes mellitus       Relevant Orders   28 Week RH+Panel      pregnancy Problems (from 08/10/19 to present)    Problem Noted Resolved   Obesity affecting pregnancy 09/27/2019 by Conard Novak, MD No   Supervision of high risk pregnancy in first trimester 08/10/2019 by Tresea Mall, CNM No   Overview Addendum 08/23/2019  8:59 PM by Vena Austria, MD    [facility] Prenatal Labs  Dating LMP = [redacted]w[redacted]d [place]   Genetic Screen 1 Screen:    AFP:     Quad:     NIPS:   Anatomic [place]    GTT Early:                Third trimester:    Rhogam    Vaccines TDAP:                       Flu Shot:   Baby Food Formula                                 Contraception BTL Pap: 2019 negative  CBB     CS/VBAC NA   Support Person Alla German          Previous Version   Obesity complicating pregnancy in first trimester 05/12/2019 by Alberteen Spindle, CNM 08/19/2019 by Alberteen Spindle, CNM     PNV, [facility] Desires PP BTL Glc nv   Annamarie Major, MD, [facility], [facility] 12/06/2019  11:40 AM

## 2019-12-07 ENCOUNTER — Other Ambulatory Visit: Payer: Self-pay | Admitting: Obstetrics & Gynecology

## 2019-12-07 NOTE — Telephone Encounter (Signed)
Called pt to notify of letter at front for pick up. She would like letter sent via mychart. Letter has been sent and per pts request, she has been notified via mychart.

## 2019-12-07 NOTE — Telephone Encounter (Signed)
Tracey Brunette do you mind calling this patient and letting her know her letter is up front? im about to help Dr Jean Rosenthal with a patient.

## 2020-01-03 ENCOUNTER — Other Ambulatory Visit: Payer: Medicaid Other

## 2020-01-03 ENCOUNTER — Ambulatory Visit (INDEPENDENT_AMBULATORY_CARE_PROVIDER_SITE_OTHER): Payer: Medicaid Other | Admitting: Obstetrics and Gynecology

## 2020-01-03 ENCOUNTER — Other Ambulatory Visit: Payer: Self-pay

## 2020-01-03 VITALS — BP 112/60 | Wt 230.0 lb

## 2020-01-03 DIAGNOSIS — Z131 Encounter for screening for diabetes mellitus: Secondary | ICD-10-CM

## 2020-01-03 DIAGNOSIS — O0991 Supervision of high risk pregnancy, unspecified, first trimester: Secondary | ICD-10-CM

## 2020-01-03 DIAGNOSIS — Z3A27 27 weeks gestation of pregnancy: Secondary | ICD-10-CM

## 2020-01-03 DIAGNOSIS — O09299 Supervision of pregnancy with other poor reproductive or obstetric history, unspecified trimester: Secondary | ICD-10-CM

## 2020-01-03 DIAGNOSIS — O99212 Obesity complicating pregnancy, second trimester: Secondary | ICD-10-CM

## 2020-01-03 LAB — POCT URINALYSIS DIPSTICK OB
Glucose, UA: NEGATIVE
POC,PROTEIN,UA: NEGATIVE

## 2020-01-03 NOTE — Progress Notes (Signed)
Routine Prenatal Care Visit  Subjective  Tracey Christensen is a 29 y.o. [phone] at [redacted]w[redacted]d being seen today for ongoing prenatal care.  She is currently monitored for the following issues for this low-risk pregnancy and has History of pre-eclampsia in prior pregnancy, currently pregnant; Supervision of high risk pregnancy in first trimester; Morbid obesity (HCC) 232 lbs; and Obesity affecting pregnancy on their problem list.  ----------------------------------------------------------------------------------- Patient reports no complaints.   Contractions: Not present. Vag. Bleeding: None.  Movement: Present. Denies leaking of fluid.  ----------------------------------------------------------------------------------- The following portions of the patient's history were reviewed and updated as appropriate: allergies, current medications, past family history, past medical history, past social history, past surgical history and problem list. Problem list updated.   Objective  Blood pressure 112/60, weight 230 lb (104.3 kg), last menstrual period 06/24/2019, unknown if currently breastfeeding. Pregravid weight 228 lb (103.4 kg) Total Weight Gain 2 lb (0.907 kg) Urinalysis:      Fetal Status: Fetal Heart Rate (bpm): 150 Fundal Height: 28 cm Movement: Present  Presentation: Vertex  General:  Alert, oriented and cooperative. Patient is in no acute distress.  Skin: Skin is warm and dry. No rash noted.   Cardiovascular: Normal heart rate noted  Respiratory: Normal respiratory effort, no problems with respiration noted  Abdomen: Soft, gravid, appropriate for gestational age. Pain/Pressure: Absent     Pelvic:  Cervical exam deferred        Extremities: Normal range of motion.     ental Status: Normal mood and affect. Normal behavior. Normal judgment and thought content.     Assessment   29 y.o. [id number] at [redacted]w[redacted]d by  03/30/2020, by Last Menstrual Period presenting for routine prenatal visit  Plan    pregnancy Problems (from 08/10/19 to present)    Problem Noted Resolved   Obesity affecting pregnancy 09/27/2019 by Conard Novak, MD No   Supervision of high risk pregnancy in first trimester 08/10/2019 by Tresea Mall, CNM No   Overview Addendum 12/06/2019 11:43 AM by Nadara Mustard, MD    [facility] Prenatal Labs  Dating LMP = [redacted]w[redacted]d [place] Blood type: A/Positive/-- (08/09 1153)   Genetic Screen  NIPS:nml XY Antibody:Negative (08/09 1153)  Anatomic [place] [facility], and f./u scan nml Rubella: 2.14 (08/09 1153)  Varicella: Immune  GTT Early:81 Third trimester:  RPR: Non Reactive (08/09 1153)   Rhogam n/a HBsAg: Negative (08/09 1153)   Vaccines TDAP:                       Flu Shot: HIV: Non Reactive (08/09 1153)   Baby Food Formula                               GBS:   GC/CT:NEG 8/21  Contraception BTL , papers [ ]  Pap: 2019 negative  CBB  no   CS/VBAC NA   Support Person 2020          Previous Version   Obesity complicating pregnancy in first trimester 05/12/2019 by 05/14/2019, CNM 08/19/2019 by 10/19/2019, CNM       Gestational age appropriate obstetric precautions including but not limited to vaginal bleeding, contractions, leaking of fluid and fetal movement were reviewed in detail with the patient.    - 28 week labs today, Rh positive  Return in about 1 year (around 01/02/2021) for annual.  01/04/2021, MD, [facility], [facility] 01/03/2020, 9:57 AM

## 2020-01-04 LAB — 28 WEEK RH+PANEL
Basophils Absolute: 0.1 10*3/uL (ref 0.0–0.2)
Basos: 1 %
EOS (ABSOLUTE): 0.1 10*3/uL (ref 0.0–0.4)
Eos: 1 %
Gestational Diabetes Screen: 102 mg/dL (ref 65–139)
HIV Screen 4th Generation wRfx: NONREACTIVE
Hematocrit: 34.5 % (ref 34.0–46.6)
Hemoglobin: 11.4 g/dL (ref 11.1–15.9)
Immature Grans (Abs): 0 10*3/uL (ref 0.0–0.1)
Immature Granulocytes: 0 %
Lymphocytes Absolute: 1.5 10*3/uL (ref 0.7–3.1)
Lymphs: 16 %
MCH: 26.5 pg — ABNORMAL LOW (ref 26.6–33.0)
MCHC: 33 g/dL (ref 31.5–35.7)
MCV: 80 fL (ref 79–97)
Monocytes Absolute: 0.5 10*3/uL (ref 0.1–0.9)
Monocytes: 6 %
Neutrophils Absolute: 7.3 10*3/uL — ABNORMAL HIGH (ref 1.4–7.0)
Neutrophils: 76 %
Platelets: 254 10*3/uL (ref 150–450)
RBC: 4.3 x10E6/uL (ref 3.77–5.28)
RDW: 13.4 % (ref 11.7–15.4)
RPR Ser Ql: NONREACTIVE
WBC: 9.5 10*3/uL (ref 3.4–10.8)

## 2020-01-15 NOTE — L&D Delivery Note (Signed)
Vaginal Delivery Note  Spontaneous delivery of live viable female infant from the ROA position through an intact  perineum. Delivery of anterior left shoulder with gentle downward guidance followed by delivery of the right posterior shoulder with gentle upward guidance. Body followed spontaneously. Infant placed on maternal chest. Nursery present and helped with neonatal resuscitation and evaluation. Cord clamped and cut after one minute. Cord blood collected. Placenta delivered spontaneously and intact with a 3 vessel cord.  No lacerations. Uterus firm and below umbilicus at the end of the delivery.  Mom and baby recovering in stable condition. Sponge and needle counts were correct at the end of the delivery.  APGARS: 1 minute:9 5 minutes: 9 Weight: 3470 grams No lacerations Epidural present  Adelene Idler MD [facility], [facility] 03/23/20 7:04 PM

## 2020-01-17 ENCOUNTER — Other Ambulatory Visit: Payer: Self-pay

## 2020-01-17 ENCOUNTER — Encounter: Payer: Self-pay | Admitting: Obstetrics & Gynecology

## 2020-01-17 ENCOUNTER — Ambulatory Visit (INDEPENDENT_AMBULATORY_CARE_PROVIDER_SITE_OTHER): Payer: Medicaid Other | Admitting: Obstetrics & Gynecology

## 2020-01-17 VITALS — BP 100/70 | Wt 232.0 lb

## 2020-01-17 DIAGNOSIS — Z23 Encounter for immunization: Secondary | ICD-10-CM

## 2020-01-17 DIAGNOSIS — Z3A29 29 weeks gestation of pregnancy: Secondary | ICD-10-CM | POA: Diagnosis not present

## 2020-01-17 DIAGNOSIS — O99213 Obesity complicating pregnancy, third trimester: Secondary | ICD-10-CM

## 2020-01-17 DIAGNOSIS — O0993 Supervision of high risk pregnancy, unspecified, third trimester: Secondary | ICD-10-CM

## 2020-01-17 DIAGNOSIS — O09299 Supervision of pregnancy with other poor reproductive or obstetric history, unspecified trimester: Secondary | ICD-10-CM

## 2020-01-17 LAB — POCT URINALYSIS DIPSTICK OB
Glucose, UA: NEGATIVE
POC,PROTEIN,UA: NEGATIVE

## 2020-01-17 NOTE — Addendum Note (Signed)
Addended by: Donnetta Hail on: 01/17/2020 10:19 AM   Modules accepted: Orders

## 2020-01-17 NOTE — Progress Notes (Signed)
  Subjective  Fetal Movement? yes Contractions? no Leaking Fluid? no Vaginal Bleeding? no  Objective  BP 100/70   Wt 232 lb (105.2 kg)   LMP 06/24/2019 (Exact Date)   BMI 38.61 kg/m  General: NAD Pumonary: no increased work of breathing Abdomen: gravid, non-tender Extremities: no edema Psychiatric: mood appropriate, affect full  Assessment  30 y.o. [id number] at [redacted]w[redacted]d by  03/30/2020, by Last Menstrual Period presenting for routine prenatal visit  Plan   Problem List Items Addressed This Visit      Other   History of pre-eclampsia in prior pregnancy, currently pregnant   Supervision of high risk pregnancy in first trimester   Obesity affecting pregnancy    Other Visit Diagnoses    [redacted] weeks gestation of pregnancy    -  Primary   Relevant Orders   POC Urinalysis Dipstick OB (Completed)      pregnancy Problems (from 08/10/19 to present)    Problem Noted Resolved   Obesity affecting pregnancy 09/27/2019 by Conard Novak, MD No   Supervision of high risk pregnancy in first trimester 08/10/2019 by Tresea Mall, CNM No   Overview Addendum 01/17/2020  9:31 AM by Nadara Mustard, MD    [facility] Prenatal Labs  Dating LMP = [redacted]w[redacted]d [place] Blood type: A/Positive/-- (08/09 1153)   Genetic Screen  NIPS:nml XY Antibody:Negative (08/09 1153)  Anatomic [place] [facility], and f./u scan nml Rubella: 2.14 (08/09 1153)  Varicella: Immune  GTT Early:81 Third trimester:  RPR: Non Reactive (08/09 1153)   Rhogam n/a HBsAg: Negative (08/09 1153)   Vaccines TDAP: 01/17/20             Flu Shot:decl HIV: Non Reactive (08/09 1153)   Baby Food Formula                               GBS:   GC/CT:NEG 8/21  Contraception BTL , papers [01/17/20 ] Pap: 2019 negative  CBB  no   CS/VBAC NA   Support Person Alla German          Previous Version   Obesity complicating pregnancy in first trimester 05/12/2019 by Alberteen Spindle, CNM 08/19/2019 by Alberteen Spindle, CNM     Tubal papers signed today, desires PP  permanent birth control  TDaP today  Decl Flu shot  PTL risks and precautions discussed (prior 33 weeks PTD, followed by term deliveries, one induced due to preeclampsia)  Monitor for s/sx preeclampsia as well  Annamarie Major, MD, [facility], [facility] 01/17/2020  9:32 AM

## 2020-01-31 ENCOUNTER — Encounter: Payer: Medicaid Other | Admitting: Obstetrics & Gynecology

## 2020-02-02 ENCOUNTER — Ambulatory Visit (INDEPENDENT_AMBULATORY_CARE_PROVIDER_SITE_OTHER): Payer: Medicaid Other | Admitting: Obstetrics & Gynecology

## 2020-02-02 ENCOUNTER — Other Ambulatory Visit: Payer: Self-pay

## 2020-02-02 VITALS — BP 118/74 | Wt 234.0 lb

## 2020-02-02 DIAGNOSIS — O09299 Supervision of pregnancy with other poor reproductive or obstetric history, unspecified trimester: Secondary | ICD-10-CM

## 2020-02-02 DIAGNOSIS — O0993 Supervision of high risk pregnancy, unspecified, third trimester: Secondary | ICD-10-CM

## 2020-02-02 DIAGNOSIS — O99213 Obesity complicating pregnancy, third trimester: Secondary | ICD-10-CM

## 2020-02-02 DIAGNOSIS — Z3A31 31 weeks gestation of pregnancy: Secondary | ICD-10-CM

## 2020-02-02 LAB — POCT URINALYSIS DIPSTICK OB
Glucose, UA: NEGATIVE
POC,PROTEIN,UA: NEGATIVE

## 2020-02-02 NOTE — Patient Instructions (Signed)
Fetal Fibronectin Test Why am I having this test? Fetal fibronectin (fFN) is a protein that your body makes during pregnancy. Having fFN in your vaginal fluid between 22 and 36 weeks of pregnancy could be a warning sign that your baby will be born early (prematurely). Babies born prematurely, or before 37 weeks, may have trouble breathing or feeding. You may have this test if you have symptoms of premature labor, such as:  Contractions.  More vaginal discharge.  Backache. What is being tested? This test checks the level of fFN in your vaginal fluid. What kind of sample is taken? This test requires a sample of fluid from inside your vagina. This sample is collected by your health care provider using a cotton swab.   How do I prepare for this test?  For 24 hours before the test, do not have sex or put anything into your vagina. Tell a health care provider about:  Any allergies you have.  Any medical conditions you have, especially any vaginal yeast infections or any symptoms of a yeast infection, including: ? Itchiness. ? Soreness. ? Unusual discharge. How are the results reported? Your results will be reported as positive or negative for fFN. If positive, results may also be given as micrograms of fFN per milliliter of vaginal fluid (mcg/mL). A result of 0.05 mcg/mL or less is considered negative. A false-positive result can occur. A false positive is incorrect because it means that a condition is present when it is not. A number of factors may lead to false-positive results, including vaginal bleeding, recent sexual intercourse, or a recent cervical exam. Your health care provider will talk to you about doing more tests to confirm your results. What do the results mean? A negative result means that no fFN was found in your vaginal fluid, meaning that premature delivery during the next 2 weeks is very unlikely. If you are still having symptoms of early labor, you may need to have this  test again in two weeks. A positive result means that fFN was found in your vaginal fluid. This means that your risk for premature labor is greater, but it does not mean that you will go into early labor. Your health care provider may do other tests and exams to closely monitor your pregnancy. Talk with your health care provider about what your results mean. Questions to ask your health care provider Ask your health care provider, or the department that is doing the test:  When will my results be ready?  How will I get my results?  What are my treatment options?  What other tests do I need?  What are my next steps? Summary  Fetal fibronectin (fFN) is a protein that your body makes during pregnancy.  Having fFN in your vaginal fluid between 22 and 36 weeks of pregnancy could be a warning sign that your baby will be born early (prematurely).  A negative result means that no fFN was found in your vaginal fluid, meaning that premature delivery over the next 2 weeks is very unlikely. This information is not intended to replace advice given to you by your health care provider. Make sure you discuss any questions you have with your health care provider. Document Revised: 02/04/2019 Document Reviewed: 08/22/2016 Elsevier Patient Education  2021 [employer].

## 2020-02-02 NOTE — Addendum Note (Signed)
Addended by: Cornelius Moras D on: 02/02/2020 02:23 PM   Modules accepted: Orders

## 2020-02-02 NOTE — Progress Notes (Signed)
Prenatal Visit Note Date: 02/02/2020 Clinic: [facility]  Subjective:  Tracey Christensen is a 30 y.o. [phone] at [redacted]w[redacted]d being seen today for ongoing prenatal care.  She is currently monitored for the following issues for this low-risk pregnancy and has History of pre-eclampsia in prior pregnancy, currently pregnant; Supervision of high risk pregnancy in first trimester; Morbid obesity (HCC) 232 lbs; and Obesity affecting pregnancy on their problem list.  Patient reports pain.  Pt having a lot of pressure and pain. No vb. No lof. Pt worried since she had pre term labor with daughter at 80 weeks. Wants vaginal exam today.   The following portions of the patient's history were reviewed and updated as appropriate: allergies, current medications, past family history, past medical history, past social history, past surgical history and problem list. Problem list updated.  Objective:   Vitals:   02/02/20 1331  BP: 118/74  Weight: 234 lb (106.1 kg)    Fetal Status:     Movement: Present     General:  Alert, oriented and cooperative. Patient is in no acute distress.  Skin: Skin is warm and dry. No rash noted.   Cardiovascular: Normal heart rate noted  Respiratory: Normal respiratory effort, no problems with respiration noted  Abdomen: Soft, gravid, appropriate for gestational age. Pain/Pressure: Present     Pelvic:  Cx FT/50/-3, VTX        Extremities: Normal range of motion.     Mental Status: Normal mood and affect. Normal behavior. Normal judgment and thought content.   Urinalysis:      Assessment and Plan:  Pregnancy: [id number] at [redacted]w[redacted]d  1. [redacted] weeks gestation of pregnancy PNV  2. Supervision of high risk pregnancy in third trimester  3. Obesity affecting pregnancy in third trimester BMI<40  4. History of pre-eclampsia in prior pregnancy, currently pregnant Monitor BP  5. Preterm labor in second trimester without delivery - Counseled on risks - Fetal fibronectin - BMZ and modified  bed rest if positive  Preterm labor symptoms and general obstetric precautions including but not limited to vaginal bleeding, contractions, leaking of fluid and fetal movement were reviewed in detail with the patient. Please refer to After Visit Summary for other counseling recommendations.   pregnancy Problems (from 08/10/19 to present)    Problem Noted Resolved   Obesity affecting pregnancy     Supervision of high risk pregnancy in first trimester     Overview Addendum 01/17/2020  9:31 AM by Nadara Mustard, MD    Clinic [facility] Prenatal Labs  Dating LMP = [redacted]w[redacted]d [place] Blood type: A/Positive/-- (08/09 1153)   Genetic Screen  NIPS:nml XY Antibody:Negative (08/09 1153)  Anatomic [place] [facility], and f./u scan nml Rubella: 2.14 (08/09 1153)  Varicella: Immune  GTT Early:81 Third trimester:  RPR: Non Reactive (08/09 1153)   Rhogam n/a HBsAg: Negative (08/09 1153)   Vaccines TDAP: 01/17/20             Flu Shot:decl HIV: Non Reactive (08/09 1153)   Baby Food Formula                               GBS:   GC/CT:NEG 8/21  Contraception BTL , papers [01/17/20 ] Pap: 2019 negative  CBB  no   CS/VBAC NA   Support Person Alla German            Return for Keep appt.  Annamarie Major, MD, [facility], [facility] 02/02/2020  2:17 PM

## 2020-02-03 ENCOUNTER — Inpatient Hospital Stay
Admission: RE | Admit: 2020-02-03 | Discharge: 2020-02-03 | Disposition: A | Discharge status: Home / Self Care | Payer: Medicaid Other | Attending: Obstetrics & Gynecology | Admitting: Obstetrics & Gynecology

## 2020-02-03 ENCOUNTER — Other Ambulatory Visit: Payer: Self-pay | Admitting: Obstetrics & Gynecology

## 2020-02-03 DIAGNOSIS — O99212 Obesity complicating pregnancy, second trimester: Secondary | ICD-10-CM

## 2020-02-03 DIAGNOSIS — O0991 Supervision of high risk pregnancy, unspecified, first trimester: Secondary | ICD-10-CM

## 2020-02-03 DIAGNOSIS — Z3A31 31 weeks gestation of pregnancy: Secondary | ICD-10-CM | POA: Diagnosis not present

## 2020-02-03 DIAGNOSIS — O09213 Supervision of pregnancy with history of pre-term labor, third trimester: Secondary | ICD-10-CM | POA: Diagnosis not present

## 2020-02-03 LAB — FETAL FIBRONECTIN: Fetal Fibronectin: POSITIVE — AB

## 2020-02-03 MED ORDER — BETAMETHASONE SOD PHOS & ACET 6 (3-3) MG/ML IJ SUSP
INTRAMUSCULAR | Status: AC
  Administered 2020-02-03: 12 mg via INTRAMUSCULAR
  Filled 2020-02-03: qty 5

## 2020-02-03 MED ORDER — BETAMETHASONE SOD PHOS & ACET 6 (3-3) MG/ML IJ SUSP: 12.0000 mg | INTRAMUSCULAR | Status: DC

## 2020-02-03 NOTE — Progress Notes (Signed)
fFN Pos D/w pt Min sx's of pain today No VB or ROM Plan BMZ today and tomorrow on [facility] L&D Modified bed rest Pt works from home, can continue for now  Annamarie Major, MD, [facility], [facility] 02/03/2020  12:20 PM

## 2020-02-03 NOTE — OB Triage Note (Addendum)
Pt. presented to L/D for first scheduled BMZ injection. Pt denies pain, contractions, LOF, or bleeding.Alert and oriented. Pt verbalized understanding of POC. Education given on S/S of labor.

## 2020-02-04 ENCOUNTER — Observation Stay
Admission: RE | Admit: 2020-02-04 | Discharge: 2020-02-04 | Disposition: A | Discharge status: Home / Self Care | Payer: Medicaid Other | Attending: Obstetrics and Gynecology | Admitting: Obstetrics and Gynecology

## 2020-02-04 DIAGNOSIS — Z3A32 32 weeks gestation of pregnancy: Secondary | ICD-10-CM | POA: Diagnosis not present

## 2020-02-04 DIAGNOSIS — O99212 Obesity complicating pregnancy, second trimester: Secondary | ICD-10-CM

## 2020-02-04 DIAGNOSIS — Z3684 Encounter for antenatal screening for fetal lung maturity: Secondary | ICD-10-CM | POA: Diagnosis not present

## 2020-02-04 DIAGNOSIS — O0991 Supervision of high risk pregnancy, unspecified, first trimester: Secondary | ICD-10-CM

## 2020-02-04 MED ORDER — BETAMETHASONE SOD PHOS & ACET 6 (3-3) MG/ML IJ SUSP
INTRAMUSCULAR | Status: AC
  Filled 2020-02-04: qty 5

## 2020-02-04 MED ORDER — BETAMETHASONE SOD PHOS & ACET 6 (3-3) MG/ML IJ SUSP
12.0000 mg | Freq: Once | INTRAMUSCULAR | Status: AC
  Administered 2020-02-04: 12 mg via INTRAMUSCULAR

## 2020-02-04 NOTE — OB Triage Note (Signed)
Pt. reports to Labor & Delivery for scheduled Betamethasone injection #2. No concerns at this time. Pt. spoke with Bonney Aid, MD prior to departure. All questions answered.

## 2020-02-04 NOTE — Discharge Summary (Signed)
Physician Final Progress Note  Patient ID: Tracey Christensen MRN: [id number] DOB/AGE: 31/29/1992 31 y.o.  Admit date: 02/04/2020 Admitting provider: Vena Austria, MD Discharge date: 02/04/2020   Admission Diagnoses: BMZ administration  Discharge Diagnoses:  Active Problems:   Labor and delivery indication for care or intervention   30 y.o. [id number] at [redacted]w[redacted]d by Estimated Date of Delivery: 03/30/20 presenting for dose 2 of 2 of BMZ today.  Consults: None  Significant Findings/ Diagnostic Studies: none  Procedures: none  Discharge Condition: good  Disposition: Discharge disposition: 01-Home or Self Care       Diet: Regular diet  Discharge Activity: Activity as tolerated  Discharge Instructions    Discharge activity:  No Restrictions   Complete by: As directed    Discharge diet:  No restrictions   Complete by: As directed    No sexual activity restrictions   Complete by: As directed    Notify physician for a general feeling that "something is not right"   Complete by: As directed    Notify physician for increase or change in vaginal discharge   Complete by: As directed    Notify physician for intestinal cramps, with or without diarrhea, sometimes described as "gas pain"   Complete by: As directed    Notify physician for leaking of fluid   Complete by: As directed    Notify physician for low, dull backache, unrelieved by heat or Tylenol   Complete by: As directed    Notify physician for menstrual like cramps   Complete by: As directed    Notify physician for pelvic pressure   Complete by: As directed    Notify physician for uterine contractions.  These may be painless and feel like the uterus is tightening or the baby is  "balling up"   Complete by: As directed    Notify physician for vaginal bleeding   Complete by: As directed    PRETERM LABOR:  Includes any of the follwing symptoms that occur between 20 - [redacted] weeks gestation.  If these symptoms are not stopped,  preterm labor can result in preterm delivery, placing your baby at risk   Complete by: As directed      Allergies as of 02/04/2020   No Known Allergies     Medication List    TAKE these medications   multivitamin-prenatal 27-0.8 MG Tabs tablet Take 1 tablet by mouth daily at 12 noon.        Total time spent taking care of this patient: 5 minutes  Signed: Vena Austria 02/04/2020, 4:35 PM

## 2020-02-14 ENCOUNTER — Encounter: Payer: Self-pay | Admitting: Obstetrics & Gynecology

## 2020-02-14 ENCOUNTER — Other Ambulatory Visit: Payer: Self-pay

## 2020-02-14 ENCOUNTER — Ambulatory Visit (INDEPENDENT_AMBULATORY_CARE_PROVIDER_SITE_OTHER): Payer: Medicaid Other | Admitting: Obstetrics & Gynecology

## 2020-02-14 VITALS — BP 120/80 | Wt 232.0 lb

## 2020-02-14 DIAGNOSIS — O09299 Supervision of pregnancy with other poor reproductive or obstetric history, unspecified trimester: Secondary | ICD-10-CM

## 2020-02-14 DIAGNOSIS — O99213 Obesity complicating pregnancy, third trimester: Secondary | ICD-10-CM

## 2020-02-14 DIAGNOSIS — Z3A33 33 weeks gestation of pregnancy: Secondary | ICD-10-CM

## 2020-02-14 DIAGNOSIS — O0993 Supervision of high risk pregnancy, unspecified, third trimester: Secondary | ICD-10-CM

## 2020-02-14 LAB — POCT URINALYSIS DIPSTICK OB
Glucose, UA: NEGATIVE
POC,PROTEIN,UA: NEGATIVE

## 2020-02-14 NOTE — Patient Instructions (Signed)

## 2020-02-14 NOTE — Progress Notes (Signed)
  Subjective  Fetal Movement? yes Contractions? Rare, mostly pelvic pressure and feels like head is low Leaking Fluid? no Vaginal Bleeding? no  Objective  BP 120/80   Wt 232 lb (105.2 kg)   LMP 06/24/2019 (Exact Date)   BMI 38.61 kg/m  General: NAD Pumonary: no increased work of breathing Abdomen: gravid, non-tender Extremities: no edema Psychiatric: mood appropriate, affect full SVE- FT, 50, ballottable Assessment  30 y.o. [id number] at [redacted]w[redacted]d by  03/30/2020, by Last Menstrual Period presenting for routine prenatal visit  Plan   Problem List Items Addressed This Visit      Other   History of pre-eclampsia in prior pregnancy, currently pregnant   Supervision of high risk pregnancy in first trimester   Obesity affecting pregnancy    Other Visit Diagnoses    [redacted] weeks gestation of pregnancy    -  Primary   Relevant Orders   POC Urinalysis Dipstick OB    Pt is s/p BMZ and home modified bed rest for PTL risks (prior hx, recent +fFN)    No changes other than pelvic pressure    Exam reassuring today PNV, [facility] Weekly visits to cont to monitor PTL precautions discussed No BP concerns today   Problem Noted Resolved   Obesity affecting pregnancy     Supervision of high risk pregnancy in first trimester     Overview Addendum 01/17/2020  9:31 AM by Nadara Mustard, MD    Clinic [facility] Prenatal Labs  Dating LMP = [redacted]w[redacted]d [place] Blood type: A/Positive/-- (08/09 1153)   Genetic Screen  NIPS:nml XY Antibody:Negative (08/09 1153)  Anatomic [place] [facility], and f./u scan nml Rubella: 2.14 (08/09 1153)  Varicella: Immune  GTT Early:81 Third trimester:  RPR: Non Reactive (08/09 1153)   Rhogam n/a HBsAg: Negative (08/09 1153)   Vaccines TDAP: 01/17/20             Flu Shot:decl HIV: Non Reactive (08/09 1153)   Baby Food Formula                               GBS:p GC/CT:NEG 8/21  Contraception BTL , papers [01/17/20 ] Pap: 2019 negative  CBB  no   CS/VBAC NA   Support Person Barry Dienes, MD, [facility], [facility] 02/14/2020  8:22 AM

## 2020-02-22 ENCOUNTER — Encounter: Payer: Self-pay | Admitting: Obstetrics & Gynecology

## 2020-02-22 ENCOUNTER — Other Ambulatory Visit: Payer: Self-pay

## 2020-02-22 ENCOUNTER — Ambulatory Visit (INDEPENDENT_AMBULATORY_CARE_PROVIDER_SITE_OTHER): Payer: Medicaid Other | Admitting: Obstetrics & Gynecology

## 2020-02-22 VITALS — BP 100/70 | Wt 234.0 lb

## 2020-02-22 DIAGNOSIS — Z3A34 34 weeks gestation of pregnancy: Secondary | ICD-10-CM

## 2020-02-22 DIAGNOSIS — O99213 Obesity complicating pregnancy, third trimester: Secondary | ICD-10-CM

## 2020-02-22 DIAGNOSIS — O09299 Supervision of pregnancy with other poor reproductive or obstetric history, unspecified trimester: Secondary | ICD-10-CM

## 2020-02-22 DIAGNOSIS — O0993 Supervision of high risk pregnancy, unspecified, third trimester: Secondary | ICD-10-CM

## 2020-02-22 LAB — POCT URINALYSIS DIPSTICK OB
Glucose, UA: NEGATIVE
POC,PROTEIN,UA: NEGATIVE

## 2020-02-22 NOTE — Progress Notes (Signed)
  Subjective  Fetal Movement? yes Contractions? Hourly, mild, no change Leaking Fluid? no Vaginal Bleeding? no  Objective  BP 100/70   Wt 234 lb (106.1 kg)   LMP 06/24/2019 (Exact Date)   BMI 38.94 kg/m  General: NAD Pumonary: no increased work of breathing Abdomen: gravid, non-tender Extremities: no edema Psychiatric: mood appropriate, affect full  Assessment  30 y.o. [id number] at [redacted]w[redacted]d by  03/30/2020, by Last Menstrual Period presenting for routine prenatal visit  Plan   Problem List Items Addressed This Visit      Other   History of pre-eclampsia in prior pregnancy, currently pregnant   Obesity affecting pregnancy    Other Visit Diagnoses    [redacted] weeks gestation of pregnancy    -  Primary   Relevant Orders   POC Urinalysis Dipstick OB   Supervision of high risk pregnancy in third trimester        PTL precautions discussed Mild cervical change to monitor Prior BMZ given Rest and pelvic rest advised  pregnancy Problems (from 08/10/19 to present)    Problem Noted Resolved   Obesity affecting pregnancy     Supervision of high risk pregnancy in first trimester     Overview Addendum 01/17/2020  9:31 AM by Nadara Mustard, MD    Clinic [facility] Prenatal Labs  Dating LMP = [redacted]w[redacted]d [place] Blood type: A/Positive/-- (08/09 1153)   Genetic Screen  NIPS:nml XY Antibody:Negative (08/09 1153)  Anatomic [place] [facility], and f./u scan nml Rubella: 2.14 (08/09 1153)  Varicella: Immune  GTT Early:81 Third trimester:  RPR: Non Reactive (08/09 1153)   Rhogam n/a HBsAg: Negative (08/09 1153)   Vaccines TDAP: 01/17/20             Flu Shot:decl HIV: Non Reactive (08/09 1153)   Baby Food Formula                               GBS:   GC/CT:NEG 8/21  Contraception BTL , papers [01/17/20 ] Pap: 2019 negative  CBB  no   CS/VBAC NA   Support Person Barry Dienes, MD, [facility], [facility] 02/22/2020  10:17 AM

## 2020-02-22 NOTE — Patient Instructions (Signed)
Thank you for choosing [facility]. As part of our ongoing efforts to improve patient experience, we would appreciate your feedback. Please fill out the short survey that you will receive by mail or MyChart. Your opinion is important to us! -Dr Harris  

## 2020-02-29 ENCOUNTER — Encounter: Payer: Self-pay | Admitting: Obstetrics and Gynecology

## 2020-02-29 ENCOUNTER — Other Ambulatory Visit (HOSPITAL_COMMUNITY)
Admission: RE | Admit: 2020-02-29 | Discharge: 2020-02-29 | Disposition: A | Discharge status: Home / Self Care | Payer: Medicaid Other | Source: Ambulatory Visit | Attending: Obstetrics and Gynecology | Admitting: Obstetrics and Gynecology

## 2020-02-29 ENCOUNTER — Other Ambulatory Visit: Payer: Self-pay

## 2020-02-29 ENCOUNTER — Ambulatory Visit (INDEPENDENT_AMBULATORY_CARE_PROVIDER_SITE_OTHER): Payer: Medicaid Other | Admitting: Obstetrics and Gynecology

## 2020-02-29 VITALS — BP 116/72 | Ht 65.0 in | Wt 235.2 lb

## 2020-02-29 DIAGNOSIS — Z3A35 35 weeks gestation of pregnancy: Secondary | ICD-10-CM

## 2020-02-29 DIAGNOSIS — O0993 Supervision of high risk pregnancy, unspecified, third trimester: Secondary | ICD-10-CM | POA: Diagnosis present

## 2020-02-29 DIAGNOSIS — R300 Dysuria: Secondary | ICD-10-CM

## 2020-02-29 LAB — POCT URINALYSIS DIPSTICK
Bilirubin, UA: NEGATIVE
Blood, UA: NEGATIVE
Glucose, UA: NEGATIVE
Ketones, UA: NEGATIVE
Protein, UA: NEGATIVE
Spec Grav, UA: 1.025 (ref 1.010–1.025)
Urobilinogen, UA: 0.2 E.U./dL
pH, UA: 6.5 (ref 5.0–8.0)

## 2020-02-29 LAB — POCT URINALYSIS DIPSTICK OB
Glucose, UA: NEGATIVE
POC,PROTEIN,UA: NEGATIVE

## 2020-02-29 NOTE — Progress Notes (Signed)
Routine Prenatal Care Visit  Subjective  Tracey Christensen is a 30 y.o. [phone] at [redacted]w[redacted]d being seen today for ongoing prenatal care.  She is currently monitored for the following issues for this high-risk pregnancy and has History of pre-eclampsia in prior pregnancy, currently pregnant; Supervision of high risk pregnancy in first trimester; Morbid obesity (HCC) 232 lbs; Obesity affecting pregnancy; and Labor and delivery indication for care or intervention on their problem list.  ----------------------------------------------------------------------------------- Patient reports pelvic pressure and pain.   Contractions: Irregular. Vag. Bleeding: None.  Movement: Present. Denies leaking of fluid.  ----------------------------------------------------------------------------------- The following portions of the patient's history were reviewed and updated as appropriate: allergies, current medications, past family history, past medical history, past social history, past surgical history and problem list. Problem list updated.   Objective  Blood pressure 116/72, height 5\' 5"  (1.651 m), weight 235 lb 3.2 oz (106.7 kg), last menstrual period 06/24/2019, unknown if currently breastfeeding. Pregravid weight 228 lb (103.4 kg) Total Weight Gain 7 lb 3.2 oz (3.266 kg) Urinalysis:      Fetal Status: Fetal Heart Rate (bpm): 160 Fundal Height: 35 cm Movement: Present  Presentation: Vertex  General:  Alert, oriented and cooperative. Patient is in no acute distress.  Skin: Skin is warm and dry. No rash noted.   Cardiovascular: Normal heart rate noted  Respiratory: Normal respiratory effort, no problems with respiration noted  Abdomen: Soft, gravid, appropriate for gestational age. Pain/Pressure: Present     Pelvic:  Cervical exam performed Dilation: 1.5 Effacement (%): 50 Station: Ballotable  Extremities: Normal range of motion.  Edema: None  Mental Status: Normal mood and affect. Normal behavior. Normal  judgment and thought content.     Assessment   29 y.o. 08/24/2019 at [redacted]w[redacted]d by  03/30/2020, by Last Menstrual Period presenting for routine prenatal visit  Plan   pregnancy Problems (from 08/10/19 to present)    Problem Noted Resolved   Obesity affecting pregnancy 09/27/2019 by 09/29/2019, MD No   Supervision of high risk pregnancy in first trimester 08/10/2019 by 08/12/2019, CNM No   Overview Addendum 01/17/2020  9:31 AM by 03/16/2020, MD    Clinic [facility] Prenatal Labs  Dating LMP = [redacted]w[redacted]d [redacted]w[redacted]d Blood type: A/Positive/-- (08/09 1153)   Genetic Screen  NIPS:nml XY Antibody:Negative (08/09 1153)  Anatomic 03[birth year]-1974 [facility], and f./u scan nml Rubella: 2.14 (08/09 1153)  Varicella: Immune  GTT Early:81 Third trimester:  RPR: Non Reactive (08/09 1153)   Rhogam n/a HBsAg: Negative (08/09 1153)   Vaccines TDAP: 01/17/20             Flu Shot:decl HIV: Non Reactive (08/09 1153)   Baby Food Formula                               GBS:   GC/CT:NEG 8/21  Contraception BTL , papers [01/17/20 ] Pap: 2019 negative  CBB  no   CS/VBAC NA   Support Person 2020          Previous Version   Obesity complicating pregnancy in first trimester 05/12/2019 by 05/14/2019, CNM 08/19/2019 by 10/19/2019, CNM      Discussed supportive care of pelvic pain. Patient declines PT referral  Will send urine for urine culture. Patient prefers to wait for cuture result. Not having symptoms of UTI.   Gestational age appropriate obstetric precautions including but not limited to vaginal bleeding, contractions,  leaking of fluid and fetal movement were reviewed in detail with the patient.    Return in about 1 week (around 03/07/2020) for ROB in person.  Natale Milch MD [facility], [facility] 02/29/2020, 9:30 AM

## 2020-02-29 NOTE — Patient Instructions (Signed)
https://www.nichd.nih.gov/health/topics/labor-delivery/Pages/default.aspx">  Third Trimester of Pregnancy  The third trimester of pregnancy is from week 28 through week 40. This is months 7 through 9. The third trimester is a time when the unborn baby (fetus) is growing rapidly. At the end of the ninth month, the fetus is about 20 inches long and weighs 6-10 pounds. Body changes during your third trimester During the third trimester, your body will continue to go through many changes. The changes vary and generally return to normal after your baby is born. Physical changes  Your weight will continue to increase. You can expect to gain 25-35 pounds (11-16 kg) by the end of the pregnancy if you begin pregnancy at a normal weight. If you are underweight, you can expect to gain 28-40 lb (about 13-18 kg), and if you are overweight, you can expect to gain 15-25 lb (about 7-11 kg).  You may begin to get stretch marks on your hips, abdomen, and breasts.  Your breasts will continue to grow and may hurt. A yellow fluid (colostrum) may leak from your breasts. This is the first milk you are producing for your baby.  You may have changes in your hair. These can include thickening of your hair, rapid growth, and changes in texture. Some people also have hair loss during or after pregnancy, or hair that feels dry or thin.  Your belly button may stick out.  You may notice more swelling in your hands, face, or ankles. Health changes  You may have heartburn.  You may have constipation.  You may develop hemorrhoids.  You may develop swollen, bulging veins (varicose veins) in your legs.  You may have increased body aches in the pelvis, back, or thighs. This is due to weight gain and increased hormones that are relaxing your joints.  You may have increased tingling or numbness in your hands, arms, and legs. The skin on your abdomen may also feel numb.  You may feel short of breath because of your  expanding uterus. Other changes  You may urinate more often because the fetus is moving lower into your pelvis and pressing on your bladder.  You may have more problems sleeping. This may be caused by the size of your abdomen, an increased need to urinate, and an increase in your body's metabolism.  You may notice the fetus "dropping," or moving lower in your abdomen (lightening).  You may have increased vaginal discharge.  You may notice that you have pain around your pelvic bone as your uterus distends. Follow these instructions at home: Medicines  Follow your health care provider's instructions regarding medicine use. Specific medicines may be either safe or unsafe to take during pregnancy. Do not take any medicines unless approved by your health care provider.  Take a prenatal vitamin that contains at least 600 micrograms (mcg) of folic acid. Eating and drinking  Eat a healthy diet that includes fresh fruits and vegetables, whole grains, good sources of protein such as meat, eggs, or tofu, and low-fat dairy products.  Avoid raw meat and unpasteurized juice, milk, and cheese. These carry germs that can harm you and your baby.  Eat 4 or 5 small meals rather than 3 large meals a day.  You may need to take these actions to prevent or treat constipation: ? Drink enough fluid to keep your urine pale yellow. ? Eat foods that are high in fiber, such as beans, whole grains, and fresh fruits and vegetables. ? Limit foods that are high in fat and   processed sugars, such as fried or sweet foods. Activity  Exercise only as directed by your health care provider. Most people can continue their usual exercise routine during pregnancy. Try to exercise for 30 minutes at least 5 days a week. Stop exercising if you experience contractions in the uterus.  Stop exercising if you develop pain or cramping in the lower abdomen or lower back.  Avoid heavy lifting.  Do not exercise if it is very hot or  humid or if you are at a high altitude.  If you choose to, you may continue to have sex unless your health care provider tells you not to. Relieving pain and discomfort  Take frequent breaks and rest with your legs raised (elevated) if you have leg cramps or low back pain.  Take warm sitz baths to soothe any pain or discomfort caused by hemorrhoids. Use hemorrhoid cream if your health care provider approves.  Wear a supportive bra to prevent discomfort from breast tenderness.  If you develop varicose veins: ? Wear support hose as told by your health care provider. ? Elevate your feet for 15 minutes, 3-4 times a day. ? Limit salt in your diet. Safety  Talk to your health care provider before traveling far distances.  Do not use hot tubs, steam rooms, or saunas.  Wear your seat belt at all times when driving or riding in a car.  Talk with your health care provider if someone is verbally or physically abusive to you. Preparing for birth To prepare for the arrival of your baby:  Take prenatal classes to understand, practice, and ask questions about labor and delivery.  Visit the hospital and tour the maternity area.  Purchase a rear-facing car seat and make sure you know how to install it in your car.  Prepare the baby's room or sleeping area. Make sure to remove all pillows and stuffed animals from the baby's crib to prevent suffocation. General instructions  Avoid cat litter boxes and soil used by cats. These carry germs that can cause birth defects in the baby. If you have a cat, ask someone to clean the litter box for you.  Do not douche or use tampons. Do not use scented sanitary pads.  Do not use any products that contain nicotine or tobacco, such as cigarettes, e-cigarettes, and chewing tobacco. If you need help quitting, ask your health care provider.  Do not use any herbal remedies, illegal drugs, or medicines that were not prescribed to you. Chemicals in these products  can harm your baby.  Do not drink alcohol.  You will have more frequent prenatal exams during the third trimester. During a routine prenatal visit, your health care provider will do a physical exam, perform tests, and discuss your overall health. Keep all follow-up visits. This is important. Where to find more information  American Pregnancy Association: americanpregnancy.org  American College of Obstetricians and Gynecologists: acog.org/en/Womens%20Health/Pregnancy  Office on Women's Health: womenshealth.gov/pregnancy Contact a health care provider if you have:  A fever.  Mild pelvic cramps, pelvic pressure, or nagging pain in your abdominal area or lower back.  Vomiting or diarrhea.  Bad-smelling vaginal discharge or foul-smelling urine.  Pain when you urinate.  A headache that does not go away when you take medicine.  Visual changes or see spots in front of your eyes. Get help right away if:  Your water breaks.  You have regular contractions less than 5 minutes apart.  You have spotting or bleeding from your vagina.  You   have severe abdominal pain.  You have difficulty breathing.  You have chest pain.  You have fainting spells.  You have not felt your baby move for the time period told by your health care provider.  You have new or increased pain, swelling, or redness in an arm or leg. Summary  The third trimester of pregnancy is from week 28 through week 40 (months 7 through 9).  You may have more problems sleeping. This can be caused by the size of your abdomen, an increased need to urinate, and an increase in your body's metabolism.  You will have more frequent prenatal exams during the third trimester. Keep all follow-up visits. This is important. This information is not intended to replace advice given to you by your health care provider. Make sure you discuss any questions you have with your health care provider. Document Revised: 06/09/2019 Document  Reviewed: 04/15/2019 Elsevier Patient Education  [employer].   Vaginal Delivery  Vaginal delivery means that you give birth by pushing your baby out of your birth canal (vagina). A team of health care providers will help you before, during, and after vaginal delivery. Birth experiences are unique for every woman and every pregnancy, and birth experiences vary depending on where you choose to give birth. What happens when I arrive at the birth center or hospital? Once you are in labor and have been admitted into the hospital or birth center, your health care provider may:  Review your pregnancy history and any concerns that you have.  Insert an IV into one of your veins. This may be used to give you fluids and medicines.  Check your blood pressure, pulse, temperature, and heart rate (vital signs).  Check whether your bag of water (amniotic sac) has broken (ruptured).  Talk with you about your birth plan and discuss pain control options. Monitoring Your health care provider may monitor your contractions (uterine monitoring) and your baby's heart rate (fetal monitoring). You may need to be monitored:  Often, but not continuously (intermittently).  All the time or for long periods at a time (continuously). Continuous monitoring may be needed if: ? You are taking certain medicines, such as medicine to relieve pain or make your contractions stronger. ? You have pregnancy or labor complications. Monitoring may be done by:  Placing a special stethoscope or a handheld monitoring device on your abdomen to check your baby's heartbeat and to check for contractions.  Placing monitors on your abdomen (external monitors) to record your baby's heartbeat and the frequency and length of contractions.  Placing monitors inside your uterus through your vagina (internal monitors) to record your baby's heartbeat and the frequency, length, and strength of your contractions. Depending on the type of  monitor, it may remain in your uterus or on your baby's head until birth.  Telemetry. This is a type of continuous monitoring that can be done with external or internal monitors. Instead of having to stay in bed, you are able to move around during telemetry. Physical exam Your health care provider may perform frequent physical exams. This may include:  Checking how and where your baby is positioned in your uterus.  Checking your cervix to determine: ? Whether it is thinning out (effacing). ? Whether it is opening up (dilating). What happens during labor and delivery? Normal labor and delivery is divided into the following three stages: Stage 1  This is the longest stage of labor.  This stage can last for hours or days.  Throughout  this stage, you will feel contractions. Contractions generally feel mild, infrequent, and irregular at first. They get stronger, more frequent (about every 2-3 minutes), and more regular as you move through this stage.  This stage ends when your cervix is completely dilated to 4 inches (10 cm) and completely effaced. Stage 2  This stage starts once your cervix is completely effaced and dilated and lasts until the delivery of your baby.  This stage may last from 20 minutes to 2 hours.  This is the stage where you will feel an urge to push your baby out of your vagina.  You may feel stretching and burning pain, especially when the widest part of your baby's head passes through the vaginal opening (crowning).  Once your baby is delivered, the umbilical cord will be clamped and cut. This usually occurs after waiting a period of 1-2 minutes after delivery.  Your baby will be placed on your bare chest (skin-to-skin contact) in an upright position and covered with a warm blanket. Watch your baby for feeding cues, like rooting or sucking, and help the baby to your breast for his or her first feeding. Stage 3  This stage starts immediately after the birth of  your baby and ends after you deliver the placenta.  This stage may take anywhere from 5 to 30 minutes.  After your baby has been delivered, you will feel contractions as your body expels the placenta and your uterus contracts to control bleeding.   What can I expect after labor and delivery?  After labor is over, you and your baby will be monitored closely until you are ready to go home to ensure that you are both healthy. Your health care team will teach you how to care for yourself and your baby.  You and your baby will stay in the same room (rooming in) during your hospital stay. This will encourage early bonding and successful breastfeeding.  You may continue to receive fluids and medicines through an IV.  Your uterus will be checked and massaged regularly (fundal massage).  You will have some soreness and pain in your abdomen, vagina, and the area of skin between your vaginal opening and your anus (perineum).  If an incision was made near your vagina (episiotomy) or if you had some vaginal tearing during delivery, cold compresses may be placed on your episiotomy or your tear. This helps to reduce pain and swelling.  You may be given a squirt bottle to use instead of wiping when you go to the bathroom. To use the squirt bottle, follow these steps: ? Before you urinate, fill the squirt bottle with warm water. Do not use hot water. ? After you urinate, while you are sitting on the toilet, use the squirt bottle to rinse the area around your urethra and vaginal opening. This rinses away any urine and blood. ? Fill the squirt bottle with clean water every time you use the bathroom.  It is normal to have vaginal bleeding after delivery. Wear a sanitary pad for vaginal bleeding and discharge. Summary  Vaginal delivery means that you will give birth by pushing your baby out of your birth canal (vagina).  Your health care provider may monitor your contractions (uterine monitoring) and your  baby's heart rate (fetal monitoring).  Your health care provider may perform a physical exam.  Normal labor and delivery is divided into three stages.  After labor is over, you and your baby will be monitored closely until you are ready to  go home. This information is not intended to replace advice given to you by your health care provider. Make sure you discuss any questions you have with your health care provider. Document Revised: 02/04/2017 Document Reviewed: 02/04/2017 Elsevier Patient Education  2021 [employer].

## 2020-03-01 LAB — CERVICOVAGINAL ANCILLARY ONLY
Chlamydia: NEGATIVE
Comment: NEGATIVE
Comment: NEGATIVE
Comment: NORMAL
Neisseria Gonorrhea: NEGATIVE
Trichomonas: NEGATIVE

## 2020-03-02 LAB — URINE CULTURE

## 2020-03-03 LAB — CULTURE, BETA STREP (GROUP B ONLY): Strep Gp B Culture: POSITIVE — AB

## 2020-03-07 ENCOUNTER — Other Ambulatory Visit: Payer: Self-pay

## 2020-03-07 ENCOUNTER — Encounter: Payer: Self-pay | Admitting: Obstetrics & Gynecology

## 2020-03-07 ENCOUNTER — Ambulatory Visit (INDEPENDENT_AMBULATORY_CARE_PROVIDER_SITE_OTHER): Payer: Medicaid Other | Admitting: Obstetrics & Gynecology

## 2020-03-07 VITALS — BP 100/60 | Wt 237.0 lb

## 2020-03-07 DIAGNOSIS — O0993 Supervision of high risk pregnancy, unspecified, third trimester: Secondary | ICD-10-CM

## 2020-03-07 DIAGNOSIS — O99213 Obesity complicating pregnancy, third trimester: Secondary | ICD-10-CM

## 2020-03-07 DIAGNOSIS — Z3A36 36 weeks gestation of pregnancy: Secondary | ICD-10-CM

## 2020-03-07 LAB — POCT URINALYSIS DIPSTICK OB
Glucose, UA: NEGATIVE
POC,PROTEIN,UA: NEGATIVE

## 2020-03-07 NOTE — Progress Notes (Signed)
  Subjective  Fetal Movement? yes Contractions? occas BHs.  Also lower pelvic pressure, head drop Leaking Fluid? no Vaginal Bleeding? no  Objective  BP 100/60   Wt 237 lb (107.5 kg)   LMP 06/24/2019 (Exact Date)   BMI 39.44 kg/m  General: NAD Pumonary: no increased work of breathing Abdomen: gravid, non-tender Extremities: no edema Psychiatric: mood appropriate, affect full SVE 2/70/-3, Vtx Assessment  29 y.o. [id number] at [redacted]w[redacted]d by  03/30/2020, by Last Menstrual Period presenting for routine prenatal visit  Plan   Problem List Items Addressed This Visit    Supervision of high risk pregnancy in first trimester - Primary   Obesity affecting pregnancy   [redacted] weeks gestation of pregnancy       Relevant Orders   POC Urinalysis Dipstick OB (Completed) [facility], [facility] Labor precautions discussed Still plans PP BTL Discussed options for IOL 39 weeks (favorable Bishop score, pt miserable)       Clinic [facility] Prenatal Labs  Dating LMP = [redacted]w[redacted]d [place] Blood type: A/Positive/-- (08/09 1153)   Genetic Screen  NIPS:nml XY Antibody:Negative (08/09 1153)  Anatomic [place] [facility], and f./u scan nml Rubella: 2.14 (08/09 1153)  Varicella: Immune  GTT Early:81 Third trimester:  RPR: Non Reactive (12/20 1028)   Rhogam n/a HBsAg: Negative (08/09 1153)   Vaccines TDAP: 01/17/20             Flu Shot:decl HIV: Non Reactive (12/20 1028)   Baby Food Formula                               XLK:[id number]/-- (02/15 5366)  GC/CT:NEG 8/21  Contraception BTL , papers [01/17/20 ] Pap: 2019 negative  CBB  no   CS/VBAC NA   Support Person Barry Dienes, MD, [facility], [facility] 03/07/2020  8:51 AM

## 2020-03-07 NOTE — Patient Instructions (Signed)

## 2020-03-10 ENCOUNTER — Other Ambulatory Visit: Payer: Self-pay

## 2020-03-10 ENCOUNTER — Ambulatory Visit (INDEPENDENT_AMBULATORY_CARE_PROVIDER_SITE_OTHER): Payer: Medicaid Other | Admitting: Advanced Practice Midwife

## 2020-03-10 ENCOUNTER — Encounter: Payer: Self-pay | Admitting: Advanced Practice Midwife

## 2020-03-10 VITALS — BP 138/80 | Wt 236.0 lb

## 2020-03-10 DIAGNOSIS — O0993 Supervision of high risk pregnancy, unspecified, third trimester: Secondary | ICD-10-CM

## 2020-03-10 DIAGNOSIS — O163 Unspecified maternal hypertension, third trimester: Secondary | ICD-10-CM

## 2020-03-10 DIAGNOSIS — O99213 Obesity complicating pregnancy, third trimester: Secondary | ICD-10-CM

## 2020-03-10 DIAGNOSIS — O09299 Supervision of pregnancy with other poor reproductive or obstetric history, unspecified trimester: Secondary | ICD-10-CM

## 2020-03-10 DIAGNOSIS — Z369 Encounter for antenatal screening, unspecified: Secondary | ICD-10-CM

## 2020-03-10 DIAGNOSIS — Z3A37 37 weeks gestation of pregnancy: Secondary | ICD-10-CM

## 2020-03-10 NOTE — Progress Notes (Signed)
Routine Prenatal Care Visit  Subjective  Tracey Christensen is a 30 y.o. [phone] at [redacted]w[redacted]d being seen today for ongoing prenatal care.  She is currently monitored for the following issues for this high-risk pregnancy and has History of pre-eclampsia in prior pregnancy, currently pregnant; Supervision of high risk pregnancy in first trimester; Morbid obesity (HCC) 232 lbs; Obesity affecting pregnancy; and Labor and delivery indication for care or intervention on their problem list.  ----------------------------------------------------------------------------------- Patient reports nausea and vomiting since yesterday, pelvic pressure ongoing. She denies headache, visual changes, epigastric pain. She denies fever, chills, shortness of breath or any viral symptoms. We discussed checking PIH labs today due to elevated above normal BP, possible normal physiological response of N/V to approaching labor. She is "ready" to have this baby and understands 39 weeks is the earliest she can be scheduled for an elective induction.   Contractions: Irregular. Vag. Bleeding: None.  Movement: Present. Leaking Fluid denies.  ----------------------------------------------------------------------------------- The following portions of the patient's history were reviewed and updated as appropriate: allergies, current medications, past family history, past medical history, past social history, past surgical history and problem list. Problem list updated.  Objective  Blood pressure 138/80, weight 236 lb (107 kg), last menstrual period 06/24/2019 Pregravid weight 228 lb (103.4 kg) Total Weight Gain 8 lb (3.629 kg) Urinalysis: Urine Protein    Urine Glucose    Fetal Status: Fetal Heart Rate (bpm): 132   Movement: Present  Presentation: Vertex  General:  Alert, oriented and cooperative. Patient is in no acute distress.  Skin: Skin is warm and dry. No rash noted.   Cardiovascular: Normal heart rate noted  Respiratory: Normal  respiratory effort, no problems with respiration noted  Abdomen: Soft, gravid, appropriate for gestational age. Pain/Pressure: Present     Pelvic:  Cervical exam performed Dilation: 3 Effacement (%): 60,70 Station: -3,-2  Extremities: Normal range of motion.  Edema: None  Mental Status: Normal mood and affect. Normal behavior. Normal judgment and thought content.   Assessment   29 y.o. [id number] at [redacted]w[redacted]d by  03/30/2020, by Last Menstrual Period presenting for work-in prenatal visit  Plan   pregnancy Problems (from 08/10/19 to present)    Problem Noted Resolved   Obesity affecting pregnancy 09/27/2019 by Conard Novak, MD No   Supervision of high risk pregnancy in first trimester 08/10/2019 by Tresea Mall, CNM No   Overview Addendum 03/07/2020  8:34 AM by Nadara Mustard, MD    [facility]inic [facility] Prenatal Labs  Dating LMP = [redacted]w[redacted]d [place] Blood type: A/Positive/-- (08/09 1153)   Genetic Screen  NIPS:nml XY Antibody:Negative (08/09 1153)  Anatomic [place] [facility], and f./u scan nml Rubella: 2.14 (08/09 1153)  Varicella: Immune  GTT Early:81 Third trimester:  RPR: Non Reactive (12/20 1028)   Rhogam n/a HBsAg: Negative (08/09 1153)   Vaccines TDAP: 01/17/20             Flu Shot:decl HIV: Non Reactive (12/20 1028)   Baby Food Formula                               BHA:[id number]/-- (02/15 0973)  GC/CT:NEG 8/21  Contraception BTL , papers [01/17/20 ] Pap: 2019 negative  CBB  no   CS/VBAC NA   Support Person Alla German          Previous Version   Obesity complicating pregnancy in first trimester 05/12/2019 by Alberteen Spindle, CNM 08/19/2019 by Alberteen Spindle,  CNM    CBC, CMP, urine protein/creatinine ratio today   Term labor symptoms and general obstetric precautions including but not limited to vaginal bleeding, contractions, leaking of fluid and fetal movement were reviewed in detail with the patient.   Return for scheduled prenatal appointment.  Tresea Mall, CNM 03/10/2020 10:42 AM

## 2020-03-11 LAB — COMPREHENSIVE METABOLIC PANEL
ALT: 8 IU/L (ref 0–32)
AST: 14 IU/L (ref 0–40)
Albumin/Globulin Ratio: 1.2 (ref 1.2–2.2)
Albumin: 3.5 g/dL — ABNORMAL LOW (ref 3.9–5.0)
Alkaline Phosphatase: 131 IU/L — ABNORMAL HIGH (ref 44–121)
BUN/Creatinine Ratio: 7 — ABNORMAL LOW (ref 9–23)
BUN: 3 mg/dL — ABNORMAL LOW (ref 6–20)
Bilirubin Total: 0.2 mg/dL (ref 0.0–1.2)
CO2: 18 mmol/L — ABNORMAL LOW (ref 20–29)
Calcium: 8.6 mg/dL — ABNORMAL LOW (ref 8.7–10.2)
Chloride: 102 mmol/L (ref 96–106)
Creatinine, Ser: 0.41 mg/dL — ABNORMAL LOW (ref 0.57–1.00)
GFR calc Af Amer: 160 mL/min/{1.73_m2} (ref >59)
GFR calc non Af Amer: 139 mL/min/{1.73_m2} (ref >59)
Globulin, Total: 2.9 g/dL (ref 1.5–4.5)
Glucose: 75 mg/dL (ref 65–99)
Potassium: 4.2 mmol/L (ref 3.5–5.2)
Sodium: 138 mmol/L (ref 134–144)
Total Protein: 6.4 g/dL (ref 6.0–8.5)

## 2020-03-11 LAB — CBC
Hematocrit: 34.3 % (ref 34.0–46.6)
Hemoglobin: 11.1 g/dL (ref 11.1–15.9)
MCH: 25.8 pg — ABNORMAL LOW (ref 26.6–33.0)
MCHC: 32.4 g/dL (ref 31.5–35.7)
MCV: 80 fL (ref 79–97)
Platelets: 231 10*3/uL (ref 150–450)
RBC: 4.3 x10E6/uL (ref 3.77–5.28)
RDW: 13.9 % (ref 11.7–15.4)
WBC: 8.8 10*3/uL (ref 3.4–10.8)

## 2020-03-11 LAB — PROTEIN / CREATININE RATIO, URINE
Creatinine, Urine: 123.3 mg/dL
Protein, Ur: 21.1 mg/dL
Protein/Creat Ratio: 171 mg/g creat (ref 0–200)

## 2020-03-13 ENCOUNTER — Other Ambulatory Visit: Payer: Self-pay

## 2020-03-13 ENCOUNTER — Ambulatory Visit (INDEPENDENT_AMBULATORY_CARE_PROVIDER_SITE_OTHER): Payer: Medicaid Other | Admitting: Obstetrics and Gynecology

## 2020-03-13 VITALS — BP 120/70 | Ht 65.0 in | Wt 238.0 lb

## 2020-03-13 DIAGNOSIS — O0991 Supervision of high risk pregnancy, unspecified, first trimester: Secondary | ICD-10-CM

## 2020-03-13 DIAGNOSIS — Z3A37 37 weeks gestation of pregnancy: Secondary | ICD-10-CM

## 2020-03-13 DIAGNOSIS — O99213 Obesity complicating pregnancy, third trimester: Secondary | ICD-10-CM

## 2020-03-13 DIAGNOSIS — O09299 Supervision of pregnancy with other poor reproductive or obstetric history, unspecified trimester: Secondary | ICD-10-CM

## 2020-03-13 LAB — POCT URINALYSIS DIPSTICK OB
Glucose, UA: NEGATIVE
POC,PROTEIN,UA: NEGATIVE

## 2020-03-13 NOTE — Patient Instructions (Addendum)
Covid Testing at 03/21/2020 between 8-10:30 AM, Drive up testing in front [facility]. Follow signs for Pre-admission testing. Please wear a mask.  Induction 03/23/2020 at 8 AM . Enter through the [facility] for an 8 AM induction. Please enter through the ER for a midnight or 5 AM induction.  Please eat a meal prior to your arrival.      Third Trimester of Pregnancy  The third trimester of pregnancy is from week 28 through week 40. This is months 7 through 9. The third trimester is a time when the unborn baby (fetus) is growing rapidly. At the end of the ninth month, the fetus is about 20 inches long and weighs 6-10 pounds. Body changes during your third trimester During the third trimester, your body will continue to go through many changes. The changes vary and generally return to normal after your baby is born. Physical changes  Your weight will continue to increase. You can expect to gain 25-35 pounds (11-16 kg) by the end of the pregnancy if you begin pregnancy at a normal weight. If you are underweight, you can expect to gain 28-40 lb (about 13-18 kg), and if you are overweight, you can expect to gain 15-25 lb (about 7-11 kg).  You may begin to get stretch marks on your hips, abdomen, and breasts.  Your breasts will continue to grow and may hurt. A yellow fluid (colostrum) may leak from your breasts. This is the first milk you are producing for your baby.  You may have changes in your hair. These can include thickening of your hair, rapid growth, and changes in texture. Some people also have hair loss during or after pregnancy, or hair that feels dry or thin.  Your belly button may stick out.  You may notice more swelling in your hands, face, or ankles. Health changes  You may have heartburn.  You may have constipation.  You may develop hemorrhoids.  You may develop swollen, bulging veins (varicose veins) in your legs.  You may have increased body aches in  the pelvis, back, or thighs. This is due to weight gain and increased hormones that are relaxing your joints.  You may have increased tingling or numbness in your hands, arms, and legs. The skin on your abdomen may also feel numb.  You may feel short of breath because of your expanding uterus. Other changes  You may urinate more often because the fetus is moving lower into your pelvis and pressing on your bladder.  You may have more problems sleeping. This may be caused by the size of your abdomen, an increased need to urinate, and an increase in your body's metabolism.  You may notice the fetus "dropping," or moving lower in your abdomen (lightening).  You may have increased vaginal discharge.  You may notice that you have pain around your pelvic bone as your uterus distends. Follow these instructions at home: Medicines  Follow your health care provider's instructions regarding medicine use. Specific medicines may be either safe or unsafe to take during pregnancy. Do not take any medicines unless approved by your health care provider.  Take a prenatal vitamin that contains at least 600 micrograms (mcg) of folic acid. Eating and drinking  Eat a healthy diet that includes fresh fruits and vegetables, whole grains, good sources of protein such as meat, eggs, or tofu, and low-fat dairy products.  Avoid raw meat and unpasteurized juice, milk, and cheese. These carry germs that can harm you  and your baby.  Eat 4 or 5 small meals rather than 3 large meals a day.  You may need to take these actions to prevent or treat constipation: ? Drink enough fluid to keep your urine pale yellow. ? Eat foods that are high in fiber, such as beans, whole grains, and fresh fruits and vegetables. ? Limit foods that are high in fat and processed sugars, such as fried or sweet foods. Activity  Exercise only as directed by your health care provider. Most people can continue their usual exercise routine  during pregnancy. Try to exercise for 30 minutes at least 5 days a week. Stop exercising if you experience contractions in the uterus.  Stop exercising if you develop pain or cramping in the lower abdomen or lower back.  Avoid heavy lifting.  Do not exercise if it is very hot or humid or if you are at a high altitude.  If you choose to, you may continue to have sex unless your health care provider tells you not to. Relieving pain and discomfort  Take frequent breaks and rest with your legs raised (elevated) if you have leg cramps or low back pain.  Take warm sitz baths to soothe any pain or discomfort caused by hemorrhoids. Use hemorrhoid cream if your health care provider approves.  Wear a supportive bra to prevent discomfort from breast tenderness.  If you develop varicose veins: ? Wear support hose as told by your health care provider. ? Elevate your feet for 15 minutes, 3-4 times a day. ? Limit salt in your diet. Safety  Talk to your health care provider before traveling far distances.  Do not use hot tubs, steam rooms, or saunas.  Wear your seat belt at all times when driving or riding in a car.  Talk with your health care provider if someone is verbally or physically abusive to you. Preparing for birth To prepare for the arrival of your baby:  Take prenatal classes to understand, practice, and ask questions about labor and delivery.  Visit the hospital and tour the maternity area.  Purchase a rear-facing car seat and make sure you know how to install it in your car.  Prepare the baby's room or sleeping area. Make sure to remove all pillows and stuffed animals from the baby's crib to prevent suffocation. General instructions  Avoid cat litter boxes and soil used by cats. These carry germs that can cause birth defects in the baby. If you have a cat, ask someone to clean the litter box for you.  Do not douche or use tampons. Do not use scented sanitary pads.  Do not  use any products that contain nicotine or tobacco, such as cigarettes, e-cigarettes, and chewing tobacco. If you need help quitting, ask your health care provider.  Do not use any herbal remedies, illegal drugs, or medicines that were not prescribed to you. Chemicals in these products can harm your baby.  Do not drink alcohol.  You will have more frequent prenatal exams during the third trimester. During a routine prenatal visit, your health care provider will do a physical exam, perform tests, and discuss your overall health. Keep all follow-up visits. This is important. Where to find more information  American Pregnancy Association: americanpregnancy.org  [employer] of Obstetricians and Gynecologists: https://www.todd-brady.net/acog.org/en/Womens%20Health/Pregnancy  Office on [employer]: MightyReward.co.nzwomenshealth.gov/pregnancy Contact a health care provider if you have:  A fever.  Mild pelvic cramps, pelvic pressure, or nagging pain in your abdominal area or lower back.  Vomiting  or diarrhea.  Bad-smelling vaginal discharge or foul-smelling urine.  Pain when you urinate.  A headache that does not go away when you take medicine.  Visual changes or see spots in front of your eyes. Get help right away if:  Your water breaks.  You have regular contractions less than 5 minutes apart.  You have spotting or bleeding from your vagina.  You have severe abdominal pain.  You have difficulty breathing.  You have chest pain.  You have fainting spells.  You have not felt your baby move for the time period told by your health care provider.  You have new or increased pain, swelling, or redness in an arm or leg. Summary  The third trimester of pregnancy is from week 28 through week 40 (months 7 through 9).  You may have more problems sleeping. This can be caused by the size of your abdomen, an increased need to urinate, and an increase in your body's metabolism.  You will have more frequent prenatal exams  during the third trimester. Keep all follow-up visits. This is important. This information is not intended to replace advice given to you by your health care provider. Make sure you discuss any questions you have with your health care provider. Document Revised: 06/09/2019 Document Reviewed: 04/15/2019 Elsevier Patient Education  2021 [employer].   Labor Induction Labor induction is when steps are taken to cause a pregnant woman to begin the labor process. Most women go into labor on their own between 37 weeks and 42 weeks of pregnancy. When this does not happen, or when there is a medical need for labor to begin, steps may be taken to induce, or bring on, labor. Labor induction causes a pregnant woman's uterus to contract. It also causes the cervix to soften (ripen), open (dilate), and thin out. Usually, labor is not induced before 39 weeks of pregnancy unless there is a medical reason to do so. When is labor induction considered? Labor induction may be right for you if:  Your pregnancy lasts longer than 41 to 42 weeks.  Your placenta is separating from your uterus (placental abruption).  You have a rupture of membranes and your labor does not begin.  You have health problems, like diabetes or high blood pressure (preeclampsia) during your pregnancy.  Your baby has stopped growing or does not have enough amniotic fluid. Before labor induction begins, your health care provider will consider the following factors:  Your medical condition and the baby's condition.  How many weeks you have been pregnant.  How mature the baby's lungs are.  The condition of your cervix.  The position of the baby.  The size of your birth canal. Tell a health care provider about:  Any allergies you have.  All medicines you are taking, including vitamins, herbs, eye drops, creams, and over-the-counter medicines.  Any problems you or your family members have had with anesthetic medicines.  Any  surgeries you have had.  Any blood disorders you have.  Any medical conditions you have. What are the risks? Generally, this is a safe procedure. However, problems may occur, including:  Failed induction.  Changes in fetal heart rate, such as being too high, too low, or irregular (erratic).  Infection in the mother or the baby.  Increased risk of having a cesarean delivery.  Breaking off (abruption) of the placenta from the uterus. This is rare.  Rupture of the uterus. This is very rare.  Your baby could fail to get enough blood  flow or oxygen. This can be life-threatening. When induction is needed for medical reasons, the benefits generally outweigh the risks. What happens during the procedure? During the procedure, your health care provider will use one of these methods to induce labor:  Stripping the membranes. In this method, the amniotic sac tissue is gently separated from the cervix. This causes the following to happen: ? Your cervix stretches, which in turn causes the release of prostaglandins. ? Prostaglandins induce labor and cause the uterus to contract. ? This procedure is often done in an office visit. You will be sent home to wait for contractions to begin.  Prostaglandin medicine. This medicine starts contractions and causes the cervix to dilate and ripen. This can be taken by mouth (orally) or by being inserted into the vagina (suppository).  Inserting a small, thin tube (catheter) with a balloon into the vagina and then expanding the balloon with water to dilate the cervix.  Breaking the water. In this method, a small instrument is used to make a small hole in the amniotic sac. This eventually causes the amniotic sac to break. Contractions should begin within a few hours.  Medicine to trigger or strengthen contractions. This medicine is given through an IV that is inserted into a vein in your arm. This procedure may vary among health care providers and hospitals.    Where to find more information  March of Dimes: www.marchofdimes.org  The [employer] of Obstetricians and Gynecologists: www.acog.org Summary  Labor induction causes a pregnant woman's uterus to contract. It also causes the cervix to soften (ripen), open (dilate), and thin out.  Labor is usually not induced before 39 weeks of pregnancy unless there is a medical reason to do so.  When induction is needed for medical reasons, the benefits generally outweigh the risks.  Talk with your health care provider about which methods of labor induction are right for you. This information is not intended to replace advice given to you by your health care provider. Make sure you discuss any questions you have with your health care provider. Document Revised: 10/14/2019 Document Reviewed: 10/14/2019 Elsevier Patient Education  2021 [employer].

## 2020-03-13 NOTE — Progress Notes (Signed)
pcpoc

## 2020-03-13 NOTE — Progress Notes (Signed)
Routine Prenatal Care Visit  Subjective  Tracey Christensen is a 30 y.o. [phone] at [redacted]w[redacted]d being seen today for ongoing prenatal care.  She is currently monitored for the following issues for this low-risk pregnancy and has History of pre-eclampsia in prior pregnancy, currently pregnant; Supervision of high risk pregnancy in first trimester; Morbid obesity (HCC) 232 lbs; Obesity affecting pregnancy; and Labor and delivery indication for care or intervention on their problem list.  ----------------------------------------------------------------------------------- Patient reports pelvic pressure. Denies headaches, vision changes and RUQ pain. Contractions: Irregular. Vag. Bleeding: None.  Movement: Present. Denies leaking of fluid.  ----------------------------------------------------------------------------------- The following portions of the patient's history were reviewed and updated as appropriate: allergies, current medications, past family history, past medical history, past social history, past surgical history and problem list. Problem list updated.   Objective  Blood pressure 120/70, height 5\' 5"  (1.651 m), weight 238 lb (108 kg), last menstrual period 06/24/2019, unknown if currently breastfeeding. Pregravid weight 228 lb (103.4 kg) Total Weight Gain 10 lb (4.536 kg) Urinalysis:      Fetal Status: Fetal Heart Rate (bpm): 135 Fundal Height: 37 cm Movement: Present  Presentation: Vertex  General:  Alert, oriented and cooperative. Patient is in no acute distress.  Skin: Skin is warm and dry. No rash noted.   Cardiovascular: Normal heart rate noted  Respiratory: Normal respiratory effort, no problems with respiration noted  Abdomen: Soft, gravid, appropriate for gestational age. Pain/Pressure: Present     Pelvic:  Cervical exam performed Dilation: 3 Effacement (%): 60 Station: -3  Extremities: Normal range of motion.  Edema: None  Mental Status: Normal mood and affect. Normal  behavior. Normal judgment and thought content.     Assessment   29 y.o. 08/24/2019 at [redacted]w[redacted]d by  03/30/2020, by Last Menstrual Period presenting for routine prenatal visit  Plan   pregnancy Problems (from 08/10/19 to present)    Problem Noted Resolved   Obesity affecting pregnancy 09/27/2019 by 09/29/2019, MD No   Supervision of high risk pregnancy in first trimester 08/10/2019 by 08/12/2019, CNM No   Overview Addendum 03/13/2020  8:10 AM by 03/15/2020, MD    Clinic [facility] Prenatal Labs  Dating LMP = [redacted]w[redacted]d [redacted]w[redacted]d Blood type: A/Positive/-- (08/09 1153)   Genetic Screen  NIPS:nml XY Antibody:Negative (08/09 1153)  Anatomic 03-04-1974 [facility], and f./u scan nml Rubella: 2.14 (08/09 1153)  Varicella: Immune  GTT Early:81 Third trimester: 102 RPR: Non Reactive (12/20 1028)   Rhogam n/a HBsAg: Negative (08/09 1153)   Vaccines TDAP: 01/17/20             Flu Shot:decl HIV: Non Reactive (12/20 1028)   Baby Food Formula                               07-21-1969-- (02/15 05-22-2000)  GC/CT:NEG 8/21  Contraception BTL , papers [01/17/20 ] Pap: 2019 negative  CBB  no   CS/VBAC NA   Support Person 2020          Previous Version   Obesity complicating pregnancy in first trimester 05/12/2019 by 05/14/2019, CNM 08/19/2019 by 10/19/2019, CNM       IOL scheduled for 03/23/2020 secondary to maternal discomfort  Gestational age appropriate obstetric precautions including but not limited to vaginal bleeding, contractions, leaking of fluid and fetal movement were reviewed in detail with the patient.    Return in about 1 week (  around 03/20/2020) for ROB in person with growth [place].  Natale Milch MD [facility], [facility] 03/13/2020, 8:28 AM

## 2020-03-13 NOTE — Progress Notes (Signed)
  [facility] INDUCTION ASSESS[facility] Tracey Christensen 08-25-1990 Medical record #: [id number] Phone #:  Home Phone [phone]  Mobile [phone]    Prenatal Provider:[facility] Delivering Group:[facility] Proposed admission date/time:03/24/2019 Method of induction:Pitocin  Weight: Filed Weights02/28/22 0804Weight:238 lb (108 kg) BMI Body mass index is 39.61 kg/m. HIV Negative HSV Negative EDC Estimated Date of Delivery: 3/17/22based on:US at [redacted] wks  Gestational age on admission: 39 wks Gravidity/parity:[id number]  Cervix Score   0 1 2 3   Position Posterior Midposition Anterior   Consistency Firm Medium Soft   Effacement (%) 0-30 40-50 60-70 >80  Dilation (cm) Closed 1-2 3-4 >5  Baby's station -3 -2 -1 +1, +2   Bishop Score:5   Medical induction of labor  select indication(s) below Elective induction ?39 weeks multiparous patient ?39 weeks primiparous patient with Bishop score ?7 ?40 weeks primiparous patient   Medical Indications Adapted from ACOG Committee Opinion #560, "Medically Indicated Late Preterm and Early Term Deliveries," 2013.  PLACENTAL / UTERINE ISSUES FETAL ISSUES MATERNAL ISSUES  ? Placenta previa (36.0-37.6) ? Isoimmunization (37.0-38.6) ? Preeclampsia without severe features or gestational HTN (37.0)  ? Suspected accreta (34.0-35.6) ? Growth Restriction 03[birth year]07-1984) ? Preeclampsia with severe features (34.0)  ? Prior classical CD, uterine window, rupture (36.0-37.6) ? Isolated (38.0-39.6) ? Chronic HTN (38.0-39.6)  ? Prior myomectomy (37.0-38.6) ? Concurrent findings (34.0-37.6) ? Cholestasis (37.0)  ? Umbilical vein varix (37.0) ? Growth Restriction (Twins) ? Diabetes  ? Placental abruption (chronic) ? Di-Di Isolated (36.0-37.6) ? Pregestational, controlled (39.0)  OBSTETRIC ISSUES ? Di-Di concurrent findings (32.0-34.6) ? Pregestational, uncontrolled (37.0-39.0)  ? Postdates ? (41 weeks) ? Mo-Di isolated (32.0-34.6)  ? Pregestational, vascular compromise (37.0- 39.0)  ? PPROM (34.0) ? Multiple Gestation ? Gestational, diet controlled (40.0)  ? Hx of IUFD (39.0 weeks) ? Di-Di (38.0-38.6) ? Gestational, med controlled (39.0)  ? Polyhydramnios, mild/moderate; SDV 8-16 or AFI 25-35 (39.0) ? Mo-Di (36.0-37.6) ? Gestational, uncontrolled (38.0-39.0)  ? Oligohydramnios (36.0-37.6); MVP <2 cm  For indications not listed above, delivery recommendations from maternal-fetal medicine consultant occurred on:   Provider Signature: Tracey Christensen Tracey Christensen Scheduled 04-02-1987 Date:03/13/2020 8:19 AM   Call [phone] to finalize the induction date/time  [phone] (07/17)

## 2020-03-20 ENCOUNTER — Ambulatory Visit (INDEPENDENT_AMBULATORY_CARE_PROVIDER_SITE_OTHER): Payer: Medicaid Other | Admitting: Obstetrics and Gynecology

## 2020-03-20 ENCOUNTER — Other Ambulatory Visit: Payer: Self-pay

## 2020-03-20 ENCOUNTER — Encounter: Payer: Self-pay | Admitting: Obstetrics and Gynecology

## 2020-03-20 VITALS — BP 120/72 | Wt 237.0 lb

## 2020-03-20 DIAGNOSIS — O09299 Supervision of pregnancy with other poor reproductive or obstetric history, unspecified trimester: Secondary | ICD-10-CM

## 2020-03-20 DIAGNOSIS — O0993 Supervision of high risk pregnancy, unspecified, third trimester: Secondary | ICD-10-CM

## 2020-03-20 DIAGNOSIS — O99213 Obesity complicating pregnancy, third trimester: Secondary | ICD-10-CM

## 2020-03-20 DIAGNOSIS — Z3A38 38 weeks gestation of pregnancy: Secondary | ICD-10-CM

## 2020-03-20 LAB — POCT URINALYSIS DIPSTICK OB: Glucose, UA: NEGATIVE

## 2020-03-20 NOTE — Progress Notes (Signed)
Routine Prenatal Care Visit  Subjective  Tracey Christensen is a 30 y.o. [phone] at [redacted]w[redacted]d being seen today for ongoing prenatal care.  She is currently monitored for the following issues for this high-risk pregnancy and has History of pre-eclampsia in prior pregnancy, currently pregnant; Supervision of high risk pregnancy in first trimester; Morbid obesity (HCC) 232 lbs; Obesity affecting pregnancy; and Labor and delivery indication for care or intervention on their problem list.  ----------------------------------------------------------------------------------- Patient reports no complaints.   Contractions: Irregular. Vag. Bleeding: None.  Movement: Present. Leaking Fluid denies.  ----------------------------------------------------------------------------------- The following portions of the patient's history were reviewed and updated as appropriate: allergies, current medications, past family history, past medical history, past social history, past surgical history and problem list. Problem list updated.  Objective  Blood pressure 120/72, weight 237 lb (107.5 kg), last menstrual period 06/24/2019, unknown if currently breastfeeding. Pregravid weight 228 lb (103.4 kg) Total Weight Gain 9 lb (4.082 kg) Urinalysis: Urine Protein Trace  Urine Glucose Negative  Fetal Status: Fetal Heart Rate (bpm): 145 Fundal Height: 39 cm Movement: Present  Presentation: Vertex  General:  Alert, oriented and cooperative. Patient is in no acute distress.  Skin: Skin is warm and dry. No rash noted.   Cardiovascular: Normal heart rate noted  Respiratory: Normal respiratory effort, no problems with respiration noted  Abdomen: Soft, gravid, appropriate for gestational age. Pain/Pressure: Present     Pelvic:  Cervical exam deferred        Extremities: Normal range of motion.  Edema: None  Mental Status: Normal mood and affect. Normal behavior. Normal judgment and thought content.   Assessment   29 y.o. [id number]  at [redacted]w[redacted]d by  03/30/2020, by Last Menstrual Period presenting for routine prenatal visit  Plan   pregnancy Problems (from 08/10/19 to present)    Problem Noted Resolved   Obesity affecting pregnancy 09/27/2019 by Conard Novak, MD No   Supervision of high risk pregnancy in first trimester 08/10/2019 by Tresea Mall, CNM No   Overview Addendum 03/13/2020  8:10 AM by Natale Milch, MD    Clinic [facility] Prenatal Labs  Dating LMP = [redacted]w[redacted]d [place] Blood type: A/Positive/-- (08/09 1153)   Genetic Screen  NIPS:nml XY Antibody:Negative (08/09 1153)  Anatomic [place] [facility], and f./u scan nml Rubella: 2.14 (08/09 1153)  Varicella: Immune  GTT Early:81 Third trimester: 102 RPR: Non Reactive (12/20 1028)   Rhogam n/a HBsAg: Negative (08/09 1153)   Vaccines TDAP: 01/17/20             Flu Shot:decl HIV: Non Reactive (12/20 1028)   Baby Food Formula                               AOZ:HYQMVHQI/-- (02/15 6962)  GC/CT:NEG 8/21  Contraception BTL , papers [01/17/20 ] Pap: 2019 negative  CBB  no   CS/VBAC NA   Support Person Alla German          Previous Version   Obesity complicating pregnancy in first trimester 05/12/2019 by Alberteen Spindle, CNM 08/19/2019 by Alberteen Spindle, CNM       Term labor symptoms and general obstetric precautions including but not limited to vaginal bleeding, contractions, leaking of fluid and fetal movement were reviewed in detail with the patient. Please refer to After Visit Summary for other counseling recommendations.   Pt declines cervical exam today. She has no symptoms of labor.   Return in 3 days (  on 03/23/2020) for IOL at [facility], as previously scheduled .   Thomasene Mohair, MD, [facility], [facility] 03/20/2020 8:40 AM

## 2020-03-21 ENCOUNTER — Other Ambulatory Visit
Admission: RE | Admit: 2020-03-21 | Discharge: 2020-03-21 | Disposition: A | Discharge status: Home / Self Care | Payer: Medicaid Other | Source: Ambulatory Visit | Attending: Obstetrics and Gynecology | Admitting: Obstetrics and Gynecology

## 2020-03-21 DIAGNOSIS — Z01812 Encounter for preprocedural laboratory examination: Secondary | ICD-10-CM | POA: Diagnosis not present

## 2020-03-21 DIAGNOSIS — Z20822 Contact with and (suspected) exposure to covid-19: Secondary | ICD-10-CM | POA: Insufficient documentation

## 2020-03-21 LAB — SARS CORONAVIRUS 2 (TAT 6-24 HRS): SARS Coronavirus 2: NEGATIVE

## 2020-03-23 ENCOUNTER — Encounter: Payer: Self-pay | Admitting: Obstetrics and Gynecology

## 2020-03-23 ENCOUNTER — Other Ambulatory Visit: Payer: Self-pay

## 2020-03-23 ENCOUNTER — Inpatient Hospital Stay
Admission: RE | Admit: 2020-03-23 | Discharge: 2020-03-24 | DRG: 797 | Disposition: A | Discharge status: Home / Self Care | Payer: Medicaid Other | Attending: Obstetrics and Gynecology | Admitting: Obstetrics and Gynecology

## 2020-03-23 ENCOUNTER — Inpatient Hospital Stay: Payer: Medicaid Other | Admitting: Anesthesiology

## 2020-03-23 DIAGNOSIS — O9081 Anemia of the puerperium: Secondary | ICD-10-CM | POA: Diagnosis not present

## 2020-03-23 DIAGNOSIS — Z3A39 39 weeks gestation of pregnancy: Secondary | ICD-10-CM

## 2020-03-23 DIAGNOSIS — E669 Obesity, unspecified: Secondary | ICD-10-CM | POA: Diagnosis present

## 2020-03-23 DIAGNOSIS — D62 Acute posthemorrhagic anemia: Secondary | ICD-10-CM | POA: Diagnosis not present

## 2020-03-23 DIAGNOSIS — O99824 Streptococcus B carrier state complicating childbirth: Secondary | ICD-10-CM | POA: Diagnosis present

## 2020-03-23 DIAGNOSIS — O99214 Obesity complicating childbirth: Secondary | ICD-10-CM | POA: Diagnosis present

## 2020-03-23 DIAGNOSIS — O26893 Other specified pregnancy related conditions, third trimester: Principal | ICD-10-CM | POA: Diagnosis present

## 2020-03-23 DIAGNOSIS — O99213 Obesity complicating pregnancy, third trimester: Secondary | ICD-10-CM

## 2020-03-23 DIAGNOSIS — Z302 Encounter for sterilization: Secondary | ICD-10-CM

## 2020-03-23 DIAGNOSIS — O0991 Supervision of high risk pregnancy, unspecified, first trimester: Secondary | ICD-10-CM

## 2020-03-23 DIAGNOSIS — Z3009 Encounter for other general counseling and advice on contraception: Secondary | ICD-10-CM

## 2020-03-23 LAB — COMPREHENSIVE METABOLIC PANEL
ALT: 9 U/L (ref 0–44)
AST: 13 U/L — ABNORMAL LOW (ref 15–41)
Albumin: 2.8 g/dL — ABNORMAL LOW (ref 3.5–5.0)
Alkaline Phosphatase: 112 U/L (ref 38–126)
Anion gap: 8 (ref 5–15)
BUN: <5 mg/dL — ABNORMAL LOW (ref 6–20)
CO2: 20 mmol/L — ABNORMAL LOW (ref 22–32)
Calcium: 8.5 mg/dL — ABNORMAL LOW (ref 8.9–10.3)
Chloride: 108 mmol/L (ref 98–111)
Creatinine, Ser: 0.45 mg/dL (ref 0.44–1.00)
GFR, Estimated: >60 mL/min (ref >60)
Glucose, Bld: 85 mg/dL (ref 70–99)
Potassium: 3.7 mmol/L (ref 3.5–5.1)
Sodium: 136 mmol/L (ref 135–145)
Total Bilirubin: 0.5 mg/dL (ref 0.3–1.2)
Total Protein: 6.6 g/dL (ref 6.5–8.1)

## 2020-03-23 LAB — PROTEIN / CREATININE RATIO, URINE
Creatinine, Urine: 115 mg/dL
Protein Creatinine Ratio: 0.14 mg/mg{Cre} (ref 0.00–0.15)
Total Protein, Urine: 16 mg/dL

## 2020-03-23 LAB — CBC
HCT: 33.2 % — ABNORMAL LOW (ref 36.0–46.0)
Hemoglobin: 10.8 g/dL — ABNORMAL LOW (ref 12.0–15.0)
MCH: 25.6 pg — ABNORMAL LOW (ref 26.0–34.0)
MCHC: 32.5 g/dL (ref 30.0–36.0)
MCV: 78.7 fL — ABNORMAL LOW (ref 80.0–100.0)
Platelets: 243 10*3/uL (ref 150–400)
RBC: 4.22 MIL/uL (ref 3.87–5.11)
RDW: 14.2 % (ref 11.5–15.5)
WBC: 7.4 10*3/uL (ref 4.0–10.5)
nRBC: 0 % (ref 0.0–0.2)

## 2020-03-23 LAB — TYPE AND SCREEN
ABO/RH(D): A POS
Antibody Screen: NEGATIVE

## 2020-03-23 LAB — ABO/RH: ABO/RH(D): A POS

## 2020-03-23 MED ORDER — LIDOCAINE HCL (PF) 1 % IJ SOLN: 30.0000 mL | INTRAMUSCULAR | Status: DC | PRN

## 2020-03-23 MED ORDER — AMMONIA AROMATIC IN INHA
RESPIRATORY_TRACT | Status: AC
  Filled 2020-03-23: qty 10

## 2020-03-23 MED ORDER — EPHEDRINE 5 MG/ML INJ: 10.0000 mg | INTRAVENOUS | Status: DC | PRN

## 2020-03-23 MED ORDER — LIDOCAINE-EPINEPHRINE (PF) 1.5 %-1:200000 IJ SOLN
INTRAMUSCULAR | Status: DC | PRN
  Administered 2020-03-23: 3 mL via PERINEURAL

## 2020-03-23 MED ORDER — WITCH HAZEL-GLYCERIN EX PADS: 1.0000 "application " | MEDICATED_PAD | CUTANEOUS | Status: DC | PRN

## 2020-03-23 MED ORDER — LACTATED RINGERS IV SOLN: INTRAVENOUS | Status: DC

## 2020-03-23 MED ORDER — EPHEDRINE 5 MG/ML INJ
10.0000 mg | INTRAVENOUS | Status: DC | PRN
  Administered 2020-03-23: 10 mg via INTRAVENOUS
  Filled 2020-03-23: qty 4

## 2020-03-23 MED ORDER — ACETAMINOPHEN 500 MG PO TABS
1000.0000 mg | ORAL_TABLET | Freq: Four times a day (QID) | ORAL | Status: DC
  Administered 2020-03-23 – 2020-03-24 (×3): 1000 mg via ORAL
  Filled 2020-03-23 (×3): qty 2

## 2020-03-23 MED ORDER — LACTATED RINGERS IV SOLN: 500.0000 mL | Freq: Once | INTRAVENOUS | Status: AC

## 2020-03-23 MED ORDER — ONDANSETRON HCL 4 MG/2ML IJ SOLN: 4.0000 mg | INTRAMUSCULAR | Status: DC | PRN

## 2020-03-23 MED ORDER — SIMETHICONE 80 MG PO CHEW: 80.0000 mg | CHEWABLE_TABLET | ORAL | Status: DC | PRN

## 2020-03-23 MED ORDER — IBUPROFEN 600 MG PO TABS
600.0000 mg | ORAL_TABLET | Freq: Four times a day (QID) | ORAL | Status: DC
  Administered 2020-03-23 – 2020-03-24 (×3): 600 mg via ORAL
  Filled 2020-03-23 (×3): qty 1

## 2020-03-23 MED ORDER — ZOLPIDEM TARTRATE 5 MG PO TABS: 5.0000 mg | ORAL_TABLET | Freq: Every evening | ORAL | Status: DC | PRN

## 2020-03-23 MED ORDER — FENTANYL 2.5 MCG/ML W/ROPIVACAINE 0.15% IN NS 100 ML EPIDURAL (ARMC)
12.0000 mL/h | EPIDURAL | Status: DC
  Administered 2020-03-23: 12 mL/h via EPIDURAL

## 2020-03-23 MED ORDER — FENTANYL 2.5 MCG/ML W/ROPIVACAINE 0.15% IN NS 100 ML EPIDURAL (ARMC)
EPIDURAL | Status: AC
  Filled 2020-03-23: qty 100

## 2020-03-23 MED ORDER — BENZOCAINE-MENTHOL 20-0.5 % EX AERO: 1.0000 "application " | INHALATION_SPRAY | CUTANEOUS | Status: DC | PRN

## 2020-03-23 MED ORDER — COCONUT OIL OIL
1.0000 | TOPICAL_OIL | Status: DC | PRN
Start: 2020-03-23 — End: 2020-03-25

## 2020-03-23 MED ORDER — DIBUCAINE (PERIANAL) 1 % EX OINT
1.0000 | TOPICAL_OINTMENT | CUTANEOUS | Status: DC | PRN
Start: 2020-03-23 — End: 2020-03-25

## 2020-03-23 MED ORDER — POVIDONE-IODINE 10 % EX SWAB: 2.0000 "application " | Freq: Once | CUTANEOUS | Status: DC

## 2020-03-23 MED ORDER — CEFAZOLIN SODIUM-DEXTROSE 2-4 GM/100ML-% IV SOLN
2.0000 g | INTRAVENOUS | Status: AC
  Administered 2020-03-24: 2 g via INTRAVENOUS
  Filled 2020-03-23 (×2): qty 100

## 2020-03-23 MED ORDER — PHENYLEPHRINE 40 MCG/ML (10ML) SYRINGE FOR IV PUSH (FOR BLOOD PRESSURE SUPPORT): 80.0000 ug | PREFILLED_SYRINGE | INTRAVENOUS | Status: DC | PRN

## 2020-03-23 MED ORDER — OXYTOCIN BOLUS FROM INFUSION
333.0000 mL | Freq: Once | INTRAVENOUS | Status: AC
  Administered 2020-03-23: 333 mL via INTRAVENOUS

## 2020-03-23 MED ORDER — LIDOCAINE HCL (PF) 1 % IJ SOLN
INTRAMUSCULAR | Status: AC
  Filled 2020-03-23: qty 30

## 2020-03-23 MED ORDER — DIPHENHYDRAMINE HCL 25 MG PO CAPS: 25.0000 mg | ORAL_CAPSULE | Freq: Four times a day (QID) | ORAL | Status: DC | PRN

## 2020-03-23 MED ORDER — ONDANSETRON HCL 4 MG PO TABS: 4.0000 mg | ORAL_TABLET | ORAL | Status: DC | PRN

## 2020-03-23 MED ORDER — OXYTOCIN-SODIUM CHLORIDE 30-0.9 UT/500ML-% IV SOLN
1.0000 m[IU]/min | INTRAVENOUS | Status: DC
  Administered 2020-03-23: 2 m[IU]/min via INTRAVENOUS
  Filled 2020-03-23: qty 1000

## 2020-03-23 MED ORDER — OXYTOCIN-SODIUM CHLORIDE 30-0.9 UT/500ML-% IV SOLN: 2.5000 [IU]/h | INTRAVENOUS | Status: DC

## 2020-03-23 MED ORDER — ACETAMINOPHEN 325 MG PO TABS: 650.0000 mg | ORAL_TABLET | ORAL | Status: DC | PRN

## 2020-03-23 MED ORDER — LACTATED RINGERS IV SOLN
500.0000 mL | INTRAVENOUS | Status: DC | PRN
  Administered 2020-03-23 (×2): 500 mL via INTRAVENOUS

## 2020-03-23 MED ORDER — MISOPROSTOL 200 MCG PO TABS
ORAL_TABLET | ORAL | Status: AC
  Filled 2020-03-23: qty 4

## 2020-03-23 MED ORDER — BUTORPHANOL TARTRATE 1 MG/ML IJ SOLN: 2.0000 mg | INTRAMUSCULAR | Status: DC | PRN

## 2020-03-23 MED ORDER — DIPHENHYDRAMINE HCL 50 MG/ML IJ SOLN: 12.5000 mg | INTRAMUSCULAR | Status: DC | PRN

## 2020-03-23 MED ORDER — OXYTOCIN 10 UNIT/ML IJ SOLN
INTRAMUSCULAR | Status: AC
  Filled 2020-03-23: qty 2

## 2020-03-23 MED ORDER — MISOPROSTOL 25 MCG QUARTER TABLET
25.0000 ug | ORAL_TABLET | ORAL | Status: DC | PRN
  Filled 2020-03-23: qty 1

## 2020-03-23 MED ORDER — SOD CITRATE-CITRIC ACID 500-334 MG/5ML PO SOLN: 30.0000 mL | ORAL | Status: DC | PRN

## 2020-03-23 MED ORDER — BUPIVACAINE HCL (PF) 0.25 % IJ SOLN
INTRAMUSCULAR | Status: DC | PRN
  Administered 2020-03-23 (×2): 3 mL via EPIDURAL

## 2020-03-23 MED ORDER — ONDANSETRON HCL 4 MG/2ML IJ SOLN: 4.0000 mg | Freq: Four times a day (QID) | INTRAMUSCULAR | Status: DC | PRN

## 2020-03-23 MED ORDER — TERBUTALINE SULFATE 1 MG/ML IJ SOLN: 0.2500 mg | Freq: Once | INTRAMUSCULAR | Status: DC | PRN

## 2020-03-23 MED ORDER — SODIUM CHLORIDE 0.9 % IV SOLN
5.0000 10*6.[IU] | Freq: Once | INTRAVENOUS | Status: AC
  Administered 2020-03-23: 5 10*6.[IU] via INTRAVENOUS
  Filled 2020-03-23: qty 5

## 2020-03-23 MED ORDER — DOCUSATE SODIUM 100 MG PO CAPS
100.0000 mg | ORAL_CAPSULE | Freq: Two times a day (BID) | ORAL | Status: DC
  Administered 2020-03-23: 100 mg via ORAL
  Filled 2020-03-23: qty 1

## 2020-03-23 MED ORDER — PRENATAL MULTIVITAMIN CH: 1.0000 | ORAL_TABLET | Freq: Every day | ORAL | Status: DC

## 2020-03-23 MED ORDER — PENICILLIN G POT IN DEXTROSE 60000 UNIT/ML IV SOLN
3.0000 10*6.[IU] | INTRAVENOUS | Status: DC
  Administered 2020-03-23 (×2): 3 10*6.[IU] via INTRAVENOUS
  Filled 2020-03-23 (×2): qty 50

## 2020-03-23 NOTE — Anesthesia Procedure Notes (Signed)
Epidural Patient location during procedure: OB Start time: 03/23/2020 4:29 PM End time: 03/23/2020 4:42 PM  Staffing Anesthesiologist: Karleen Hampshire, MD Resident/CRNA: Elmarie Mainland, CRNA Performed: resident/CRNA   Preanesthetic Checklist Completed: patient identified, IV checked, site marked, risks and benefits discussed, surgical consent, monitors and equipment checked, pre-op evaluation and timeout performed  Epidural Patient position: sitting Prep: ChloraPrep Patient monitoring: continuous pulse ox and blood pressure Approach: midline Location: L3-L4 Injection technique: LOR saline  Needle:  Needle type: Tuohy  Needle gauge: 17 G Needle length: 9 cm and 9 Needle insertion depth: 6 cm Catheter type: closed end flexible Catheter size: 19 Gauge Catheter at skin depth: 11 cm Test dose: negative and 1.5% lidocaine with Epi 1:200 K  Assessment Sensory level: T10 Events: blood not aspirated, injection not painful, no injection resistance, no paresthesia and negative IV test  Additional Notes 1 attempt Pt. Evaluated and documentation done after procedure finished. Patient identified. Risks/Benefits/Options discussed with patient including but not limited to bleeding, infection, nerve damage, paralysis, failed block, incomplete pain control, headache, blood pressure changes, nausea, vomiting, reactions to medication both or allergic, itching and postpartum back pain. Confirmed with bedside nurse the patient's most recent platelet count. Confirmed with patient that they are not currently taking any anticoagulation, have any bleeding history or any family history of bleeding disorders. Patient expressed understanding and wished to proceed. All questions were answered. Sterile technique was used throughout the entire procedure. Please see nursing notes for vital signs. Test dose was given through epidural catheter and negative prior to continuing to dose epidural or start  infusion. Warning signs of high block given to the patient including shortness of breath, tingling/numbness in hands, complete motor block, or any concerning symptoms with instructions to call for help. Patient was given instructions on fall risk and not to get out of bed. All questions and concerns addressed with instructions to call with any issues or inadequate analgesia.   Patient tolerated the insertion well without immediate complications.Reason for block:procedure for pain

## 2020-03-23 NOTE — Anesthesia Preprocedure Evaluation (Signed)
Anesthesia Evaluation  Patient identified by MRN, date of birth, ID band Patient awake    Reviewed: Allergy & Precautions, H&P , NPO status , Patient's Chart, lab work & pertinent test results  Airway Mallampati: III       Dental   Pulmonary neg pulmonary ROS,           Cardiovascular Exercise Tolerance: Good hypertension,      Neuro/Psych    GI/Hepatic negative GI ROS,   Endo/Other    Renal/GU   negative genitourinary   Musculoskeletal   Abdominal   Peds  Hematology negative hematology ROS (+)   Anesthesia Other Findings Obese, BMI 39  Past Medical History: No date: Gestational hypertension  Past Surgical History: No date: HAND SURGERY; Right  BMI    Body Mass Index: 39.44 kg/m      Reproductive/Obstetrics (+) Pregnancy                             Anesthesia Physical Anesthesia Plan  ASA: II  Anesthesia Plan: Epidural   Post-op Pain Management:    Induction:   PONV Risk Score and Plan:   Airway Management Planned:   Additional Equipment:   Intra-op Plan:   Post-operative Plan:   Informed Consent: I have reviewed the patients History and Physical, chart, labs and discussed the procedure including the risks, benefits and alternatives for the proposed anesthesia with the patient or authorized representative who has indicated his/her understanding and acceptance.       Plan Discussed with: Anesthesiologist  Anesthesia Plan Comments:         Anesthesia Quick Evaluation

## 2020-03-23 NOTE — H&P (Signed)
OB History & Physical   History of Present Illness:  Chief Complaint: induction of labor  HPI:  Tracey Christensen is a 30 y.o. [phone] female at [redacted]w[redacted]d dated by LMP c/w 8w u/s. Her pregnancy has been complicated by history of preeclampsia, obesity- BMI 39, GBS positive status.    She reports irregular contractions.   She denies leakage of fluid.   She denies vaginal bleeding.   She reports fetal movement.    Total weight gain for pregnancy: 4.082 kg   Obstetrical Problem List: pregnancy Problems (from 08/10/19 to present)    Problem Noted Resolved   Obesity affecting pregnancy 09/27/2019 by Conard Novak, MD No   Supervision of high risk pregnancy in first trimester 08/10/2019 by Tresea Mall, CNM No   Overview Addendum 03/13/2020  8:10 AM by Natale Milch, MD    Clinic [facility] Prenatal Labs  Dating LMP = [redacted]w[redacted]d [place] Blood type: A/Positive/-- (08/09 1153)   Genetic Screen  NIPS:nml XY Antibody:Negative (08/09 1153)  Anatomic [place] [facility], and f./u scan nml Rubella: 2.14 (08/09 1153)  Varicella: Immune  GTT Early:81 Third trimester: 102 RPR: Non Reactive (12/20 1028)   Rhogam n/a HBsAg: Negative (08/09 1153)   Vaccines TDAP: 01/17/20             Flu Shot:decl HIV: Non Reactive (12/20 1028)   Baby Food Formula                               ZTI:WPYKDXIP/-- (02/15 3825)  GC/CT:NEG 8/21  Contraception BTL , papers [01/17/20 ] Pap: 2019 negative  CBB  no   CS/VBAC NA   Support Person Alla German          Previous Version   Obesity complicating pregnancy in first trimester 05/12/2019 by Alberteen Spindle, CNM 08/19/2019 by Alberteen Spindle, CNM       Maternal Medical History:   Past Medical History:  Diagnosis Date  . Gestational hypertension     Past Surgical History:  Procedure Laterality Date  . HAND SURGERY Right     No Known Allergies  Prior to Admission medications   Medication Sig Start Date End Date Taking? Authorizing Provider  Prenatal Vit-Fe Fumarate-FA  (MULTIVITAMIN-PRENATAL) 27-0.8 MG TABS tablet Take 1 tablet by mouth daily at 12 noon.   Yes [provider]  famotidine (PEPCID) 20 MG tablet Take 1 tablet (20 mg total) by mouth 2 (two) times daily. 09/06/15 11/11/18  Sharman Cheek, MD    OB History  Gravida Para Term Preterm AB Living  4 3 2 1  0 3  SAB IAB Ectopic Multiple Live Births  0 0 0 0 3    # Outcome Date GA Lbr Len/2nd Weight Sex Delivery Anes PTL Lv  4 Current           3 Term 2019 [redacted]w[redacted]d  4536 g M Vag-Spont   LIV  2 Term 2015 [redacted]w[redacted]d  2722 g M Vag-Spont   LIV  1 Preterm 2013 [redacted]w[redacted]d  2268 g F Vag-Spont   LIV    Prenatal care site: [facility]  Social History: She  reports that she has never smoked. She has never used smokeless tobacco. She reports previous alcohol use. She reports that she does not use drugs.  Family History: family history includes Diabetes in her maternal grandmother and paternal grandfather; Hypertension in her paternal grandmother.    Review of Systems:  Review of Systems  Constitutional: Negative for chills and fever.  HENT: Negative for congestion, ear discharge, ear pain, hearing loss, sinus pain and sore throat.   Eyes: Negative for blurred vision and double vision.  Respiratory: Negative for cough, shortness of breath and wheezing.   Cardiovascular: Negative for chest pain, palpitations and leg swelling.  Gastrointestinal: Negative for abdominal pain, blood in stool, constipation, diarrhea, heartburn, melena, nausea and vomiting.  Genitourinary: Negative for dysuria, flank pain, frequency, hematuria and urgency.  Musculoskeletal: Negative for back pain, joint pain and myalgias.  Skin: Negative for itching and rash.  Neurological: Negative for dizziness, tingling, tremors, sensory change, speech change, focal weakness, seizures, loss of consciousness, weakness and headaches.  Endo/Heme/Allergies: Negative for environmental allergies. Does not bruise/bleed easily.   Psychiatric/Behavioral: Negative for depression, hallucinations, memory loss, substance abuse and suicidal ideas. The patient is not nervous/anxious and does not have insomnia.      Physical Exam:  BP 128/76 (BP Location: Left Arm)   Pulse 72   Temp 98.6 F (37 C) (Oral)   Resp 17   Ht 5\' 5"  (1.651 m)   Wt 107.5 kg   LMP 06/24/2019 (Exact Date)   BMI 39.44 kg/m   Constitutional: Well nourished, well developed female in no acute distress.  HEENT: normal Skin: Warm and dry.  Cardiovascular: Regular rate and rhythm.   Extremity: no edema  Respiratory: Clear to auscultation bilateral. Normal respiratory effort Abdomen: Gravid, FHT present Back: no CVAT Neuro: DTRs 2+, Cranial nerves grossly intact Psych: Alert and Oriented x3. No memory deficits. Normal mood and affect.  MS: normal gait, normal bilateral lower extremity ROM/strength/stability.  Pelvic exam: (female chaperone present) is not limited by body habitus EGBUS: within normal limits Vagina: within normal limits and with normal mucosa  Cervix: 3 with sweep to 4/60/-2   Baseline FHR: 135 beats/min   Variability: moderate   Accelerations: present   Decelerations: absent Contractions: present frequency: irregular Overall assessment: reassuring   Lab Results  Component Value Date   SARSCOV2NAA NEGATIVE 03/21/2020  ]  Assessment:  Tracey Christensen is a 30 y.o. G32P2103 female at 108w0d with planned induction of labor at 39 weeks   Plan:  1. Admit to Labor & Delivery  2. CBC, T&S, Clrs, IVF 3. GBS positive: Penicillin prophylaxis started at 9 AM.   4. Fetal well-being: Category I 5. Begin induction with pitocin, consider AROM at next check   [redacted]w[redacted]d, CNM 03/23/2020 10:16 AM

## 2020-03-23 NOTE — Discharge Summary (Signed)
Postpartum Discharge Summary    Patient Name: Tracey Christensen DOB: Jul 13, 1990 MRN: [id number]  Date of admission: 03/23/2020 Delivery date3/10/2020  :Delivering provider: Adrian Prows R  Date of discharge: 03/24/20  Admitting diagnosis: [redacted] weeks gestation of pregnancy [Z3A.39] Intrauterine pregnancy: [redacted]w[redacted]d    Secondary diagnosis:  Active Problems:   [redacted] weeks gestation of pregnancy   Unwanted fertility  Additional problems: None    Discharge diagnosis: Term Pregnancy Delivered                                              Post partum procedures:postpartum tubal ligation 03/24/2020 Augmentation: AROM and Pitocin Complications: None  Hospital course: Induction of Labor With Vaginal Delivery   30y.o. yo [id number] admitted to the hospital 03/23/2020 for induction of labor.  Indication for induction: Favorable cervix at term.  Patient had an uncomplicated labor course as follows: Membrane Rupture Time/Date: 3:56 PM ,03/23/2020   Delivery Method:Vaginal, Spontaneous  Episiotomy: None  Lacerations:  None  Details of delivery can be found in separate delivery note.  Patient had a routine postpartum course. Patient is discharged home 03/24/20.  Newborn Data: Birth date:03/23/2020  Birth time:6:28 PM  Gender:Female  Living status:Living  Apgars:9 ,9  Weight:3470 g   Magnesium Sulfate received: No BMZ received: No Rhophylac:No MMR:No T-DaP:Given prenatally Transfusion:No Immunization History  Administered Date(s) Administered   Tdap 01/17/2020   Physical exam  Vitals:   03/24/20 1345 03/24/20 1355 03/24/20 1416 03/24/20 1532  BP: 111/81 110/79 113/76 (!) 113/59  Pulse: (!) 59 62 60 62  Resp: 14 15 16 16   Temp:  (!) 97.5 F (36.4 C) 97.9 F (36.6 C) 98.7 F (37.1 C)  TempSrc:    Oral  SpO2: 96% 99% 100% 99%  Weight:      Height:       General: alert, cooperative and no distress Lochia: appropriate Uterine Fundus: firm Incision: Healing well with  no significant drainage DVT Evaluation: No evidence of DVT seen on physical exam. Labs: Lab Results  Component Value Date   WBC 13.2 (H) 03/24/2020   HGB 10.7 (L) 03/24/2020   HCT 32.0 (L) 03/24/2020   MCV 78.2 (L) 03/24/2020   PLT 205 03/24/2020   CMP Latest Ref Rng & Units 03/23/2020  Glucose 70 - 99 mg/dL 85  BUN 6 - 20 mg/dL <5(L)  Creatinine 0.44 - 1.00 mg/dL 0.45  Sodium 135 - 145 mmol/L 136  Potassium 3.5 - 5.1 mmol/L 3.7  Chloride 98 - 111 mmol/L 108  CO2 22 - 32 mmol/L 20(L)  Calcium 8.9 - 10.3 mg/dL 8.5(L)  Total Protein 6.5 - 8.1 g/dL 6.6  Total Bilirubin 0.3 - 1.2 mg/dL 0.5  Alkaline Phos 38 - 126 U/L 112  AST 15 - 41 U/L 13(L)  ALT 0 - 44 U/L 9   Edinburgh Score: Edinburgh Postnatal Depression Scale Screening Tool 03/24/2020  I have been able to laugh and see the funny side of things. 0  I have looked forward with enjoyment to things. 0  I have blamed myself unnecessarily when things went wrong. 0  I have been anxious or worried for no good reason. 0  I have felt scared or panicky for no good reason. 0  Things have been getting on top of me. 0  I have been so unhappy that  I have had difficulty sleeping. 0  I have felt sad or miserable. 0  I have been so unhappy that I have been crying. 0  The thought of harming myself has occurred to me. 0  Edinburgh Postnatal Depression Scale Total 0      After visit meds:  Allergies as of 03/24/2020   No Known Allergies     Medication List    TAKE these medications   ibuprofen 600 MG tablet Commonly known as: ADVIL Take 1 tablet (600 mg total) by mouth every 6 (six) hours.   multivitamin-prenatal 27-0.8 MG Tabs tablet Take 1 tablet by mouth daily at 12 noon.   oxyCODONE-acetaminophen 5-325 MG tablet Commonly known as: Percocet Take 1 tablet by mouth every 4 (four) hours as needed for severe pain.            Discharge Care Instructions  (From admission, onward)         Start     Ordered   03/24/20  0000  No dressing needed        03/24/20 1855   03/24/20 0000  Discharge wound care:       Comments: May shower and use soap on incision   03/24/20 1855           Discharge home in stable condition Infant Feeding: Given prenatally Infant Disposition:home with mother Discharge instruction: per After Visit Summary and Postpartum booklet. Activity: Advance as tolerated. Pelvic rest for 6 weeks.  Diet: routine diet Anticipated Birth Control: BTL done PP Postpartum Appointment:6 weeks Additional Postpartum F/U: none Future Appointments:No future appointments. Follow up Visit:  Follow-up Information    Schuman, Christanna R, MD Follow up in 2 week(s).   Specialty: Obstetrics and Gynecology Contact information: [address] [place] [address] [phone]                   03/24/2020 Malachy Mood, MD

## 2020-03-24 ENCOUNTER — Inpatient Hospital Stay: Payer: Medicaid Other | Admitting: Registered Nurse

## 2020-03-24 ENCOUNTER — Encounter: Payer: Self-pay | Admitting: Obstetrics and Gynecology

## 2020-03-24 ENCOUNTER — Encounter
Admission: RE | Disposition: A | Discharge status: Home / Self Care | Payer: Self-pay | Source: Home / Self Care | Attending: Obstetrics and Gynecology

## 2020-03-24 DIAGNOSIS — Z302 Encounter for sterilization: Secondary | ICD-10-CM

## 2020-03-24 DIAGNOSIS — Z3009 Encounter for other general counseling and advice on contraception: Secondary | ICD-10-CM

## 2020-03-24 HISTORY — PX: TUBAL LIGATION: SHX77

## 2020-03-24 LAB — CBC
HCT: 29.7 % — ABNORMAL LOW (ref 36.0–46.0)
HCT: 32 % — ABNORMAL LOW (ref 36.0–46.0)
Hemoglobin: 9.8 g/dL — ABNORMAL LOW (ref 12.0–15.0)
Hemoglobin: 10.7 g/dL — ABNORMAL LOW (ref 12.0–15.0)
MCH: 25.7 pg — ABNORMAL LOW (ref 26.0–34.0)
MCH: 26.2 pg (ref 26.0–34.0)
MCHC: 33 g/dL (ref 30.0–36.0)
MCHC: 33.4 g/dL (ref 30.0–36.0)
MCV: 77.7 fL — ABNORMAL LOW (ref 80.0–100.0)
MCV: 78.2 fL — ABNORMAL LOW (ref 80.0–100.0)
Platelets: 207 10*3/uL (ref 150–400)
Platelets: 205 10*3/uL (ref 150–400)
RBC: 3.82 MIL/uL — ABNORMAL LOW (ref 3.87–5.11)
RBC: 4.09 MIL/uL (ref 3.87–5.11)
RDW: 14 % (ref 11.5–15.5)
RDW: 14.4 % (ref 11.5–15.5)
WBC: 11.2 10*3/uL — ABNORMAL HIGH (ref 4.0–10.5)
WBC: 13.2 10*3/uL — ABNORMAL HIGH (ref 4.0–10.5)
nRBC: 0 % (ref 0.0–0.2)
nRBC: 0 % (ref 0.0–0.2)

## 2020-03-24 LAB — RPR: RPR Ser Ql: NONREACTIVE

## 2020-03-24 SURGERY — LIGATION, FALLOPIAN TUBE, POSTPARTUM
Anesthesia: Spinal | Site: Abdomen | Laterality: Bilateral

## 2020-03-24 MED ORDER — IBUPROFEN 600 MG PO TABS: 600.0000 mg | ORAL_TABLET | Freq: Four times a day (QID) | ORAL | Status: DC | PRN

## 2020-03-24 MED ORDER — BUPIVACAINE IN DEXTROSE 0.75-8.25 % IT SOLN
INTRATHECAL | Status: DC | PRN
  Administered 2020-03-24: 2 mL via INTRATHECAL

## 2020-03-24 MED ORDER — MIDAZOLAM HCL 2 MG/2ML IJ SOLN
INTRAMUSCULAR | Status: AC
  Filled 2020-03-24: qty 2

## 2020-03-24 MED ORDER — ONDANSETRON HCL 4 MG/2ML IJ SOLN
INTRAMUSCULAR | Status: AC
  Filled 2020-03-24: qty 2

## 2020-03-24 MED ORDER — IBUPROFEN 600 MG PO TABS: 600.0000 mg | ORAL_TABLET | Freq: Four times a day (QID) | ORAL | Status: DC

## 2020-03-24 MED ORDER — PHENYLEPHRINE HCL (PRESSORS) 10 MG/ML IV SOLN
INTRAVENOUS | Status: DC | PRN
  Administered 2020-03-24: 100 ug via INTRAVENOUS

## 2020-03-24 MED ORDER — DEXAMETHASONE SODIUM PHOSPHATE 10 MG/ML IJ SOLN
INTRAMUSCULAR | Status: AC
  Filled 2020-03-24: qty 1

## 2020-03-24 MED ORDER — ROCURONIUM BROMIDE 10 MG/ML (PF) SYRINGE
PREFILLED_SYRINGE | INTRAVENOUS | Status: AC
  Filled 2020-03-24: qty 10

## 2020-03-24 MED ORDER — IBUPROFEN 600 MG PO TABS: 600.0000 mg | ORAL_TABLET | Freq: Four times a day (QID) | ORAL | 0 refills | Status: DC

## 2020-03-24 MED ORDER — MIDAZOLAM HCL 5 MG/5ML IJ SOLN
INTRAMUSCULAR | Status: DC | PRN
  Administered 2020-03-24: 2 mg via INTRAVENOUS

## 2020-03-24 MED ORDER — ONDANSETRON HCL 4 MG/2ML IJ SOLN
INTRAMUSCULAR | Status: DC | PRN
  Administered 2020-03-24: 4 mg via INTRAVENOUS

## 2020-03-24 MED ORDER — OXYCODONE HCL 5 MG PO TABS
5.0000 mg | ORAL_TABLET | ORAL | Status: DC | PRN
  Administered 2020-03-24: 5 mg via ORAL
  Filled 2020-03-24: qty 1
  Filled 2020-03-24: qty 2

## 2020-03-24 MED ORDER — LIDOCAINE HCL (PF) 2 % IJ SOLN
INTRAMUSCULAR | Status: AC
  Filled 2020-03-24: qty 5

## 2020-03-24 MED ORDER — OXYCODONE-ACETAMINOPHEN 5-325 MG PO TABS: 1.0000 | ORAL_TABLET | ORAL | 0 refills | Status: DC | PRN

## 2020-03-24 MED ORDER — FENTANYL CITRATE (PF) 100 MCG/2ML IJ SOLN: 25.0000 ug | INTRAMUSCULAR | Status: DC | PRN

## 2020-03-24 MED ORDER — DEXMEDETOMIDINE (PRECEDEX) IN NS 20 MCG/5ML (4 MCG/ML) IV SYRINGE
PREFILLED_SYRINGE | INTRAVENOUS | Status: AC
  Filled 2020-03-24: qty 5

## 2020-03-24 MED ORDER — PROPOFOL 10 MG/ML IV BOLUS
INTRAVENOUS | Status: AC
  Filled 2020-03-24: qty 20

## 2020-03-24 MED ORDER — CEFAZOLIN SODIUM-DEXTROSE 2-4 GM/100ML-% IV SOLN
INTRAVENOUS | Status: AC
  Filled 2020-03-24: qty 100

## 2020-03-24 MED ORDER — DEXAMETHASONE SODIUM PHOSPHATE 10 MG/ML IJ SOLN
INTRAMUSCULAR | Status: DC | PRN
  Administered 2020-03-24: 5 mg via INTRAVENOUS

## 2020-03-24 MED ORDER — BUPIVACAINE HCL 0.5 % IJ SOLN
INTRAMUSCULAR | Status: DC | PRN
  Administered 2020-03-24: 6 mL

## 2020-03-24 MED ORDER — PROPOFOL 500 MG/50ML IV EMUL
INTRAVENOUS | Status: AC
  Filled 2020-03-24: qty 50

## 2020-03-24 SURGICAL SUPPLY — 27 items
CHLORAPREP W/TINT 26 (MISCELLANEOUS) ×2 IMPLANT
COVER WAND RF STERILE (DRAPES) ×2 IMPLANT
DERMABOND ADVANCED (GAUZE/BANDAGES/DRESSINGS) ×1
DERMABOND ADVANCED .7 DNX12 (GAUZE/BANDAGES/DRESSINGS) ×1 IMPLANT
DRAPE LAPAROTOMY 100X77 ABD (DRAPES) ×2 IMPLANT
DRSG TEGADERM 2-3/8X2-3/4 SM (GAUZE/BANDAGES/DRESSINGS) IMPLANT
DRSG TELFA 4X3 1S NADH ST (GAUZE/BANDAGES/DRESSINGS) ×2 IMPLANT
ELECT REM PT RETURN 9FT ADLT (ELECTROSURGICAL) ×2
ELECTRODE REM PT RTRN 9FT ADLT (ELECTROSURGICAL) ×1 IMPLANT
GLOVE SURG ENC MOIS LTX SZ7 (GLOVE) ×2 IMPLANT
GLOVE SURG UNDER LTX SZ7.5 (GLOVE) ×2 IMPLANT
GOWN STRL REUS W/ TWL LRG LVL3 (GOWN DISPOSABLE) ×2 IMPLANT
GOWN STRL REUS W/TWL LRG LVL3 (GOWN DISPOSABLE) ×4
LABEL OR SOLS (LABEL) ×2 IMPLANT
MANIFOLD NEPTUNE II (INSTRUMENTS) ×2 IMPLANT
NEEDLE HYPO 22GX1.5 SAFETY (NEEDLE) ×2 IMPLANT
NS IRRIG 500ML POUR BTL (IV SOLUTION) ×2 IMPLANT
PACK BASIN MINOR ARMC (MISCELLANEOUS) ×2 IMPLANT
SPONGE LAP 4X18 RFD (DISPOSABLE) ×2 IMPLANT
SUT CHROMIC GUT BROWN 0 54 (SUTURE) ×1 IMPLANT
SUT CHROMIC GUT BROWN 0 54IN (SUTURE) ×2
SUT MNCRL 4-0 (SUTURE) ×2
SUT MNCRL 4-0 27XMFL (SUTURE) ×1
SUT VIC AB 0 UR5 27 (SUTURE) ×2 IMPLANT
SUTURE MNCRL 4-0 27XMF (SUTURE) ×1 IMPLANT
SYR 10ML LL (SYRINGE) ×2 IMPLANT
SYR BULB IRRIG 60ML STRL (SYRINGE) ×2 IMPLANT

## 2020-03-24 NOTE — Op Note (Signed)
Preoperative Diagnosis: 1) 30 y.o. [name] PPD1 desiring permanent surgical sterilization  Postoperative Diagnosis: 1) 30 y.o. [name] PPD1 desiring permanent surgical sterilization  Operation Performed: Postpartum bilateral tubal ligation via Pomeroy method  Indication: 30 y.o. [name]  with undesired fertility, desires permanent sterilization.  Other reversible forms of contraception were discussed with patient; she declines all other modalities. Permanent nature of as well as associated risks of the procedure discussed with patient including but not limited to: risk of regret, permanence of method, bleeding, infection, injury to surrounding organs and need for additional procedures.  Failure risk of 0.5-1% with increased risk of ectopic gestation if pregnancy occurs was also discussed with patient.    Surgeon: Vena Austria, MD  Anesthesia: spinal  Preoperative Antibiotics: none  Estimated Blood Loss: 5 mL  IV Fluids:  Urine Output:: N/A  Drains or Tubes: none  Implants: none  Specimens Removed: Bilateral segment of fallopian tube  Complications: none  Intraoperative Findings: Normal tubes, ovaries, and uterine fundus.  Good 1cm portion of tube excised from each fallopian tube with tubal ostia visualized.  Patient Condition: stable  Procedure in Detail:  Patient was taken to the operating room where she was administered general anesthesia.  She was positioned in the supine position, prepped and draped in the usual sterile fashion.  Prior to proceeding with procedure a time out was performed.  Attention was turned to the patient's abdomen.  The umbilicus was infiltrated with 1% Sensorcaine, before making a 3cm vertical infraumbilical stab incision using an 11 blade scalpel.  The subcutaneous tissue was dissected of the rectus fascia using a hemostat.  The fascia was then grasped with the hemostat, tented up, regrasped with second hemostat before releasing and re-grasping  with the first hemostat to verify no bowl was grasped.  The fascia was incised using a mayo scissors.  The fascial edges were taged with 0 Vicryl stay sutures prior to removing them.  Army[employer] retractors were placed the peritoneum was identified, grasped with a hemostat, incised using metzenbaum scissors after verifying that the tissue was translucent by placing the scissors behind the tissue to verify it was free of omentum or bowl.  The operators fingers was placed through the incision and noted it to be free of adhesive disease or umbilical hernias.  The table was put into left tilt, a small moist lap pad tagged with a hemostat was then placed through in incision using Singley forceps to pack away and bowl or omentum.  The right fallopian tube was identified and walked out to its fimbriated end using the Singley forceps and a Babcock clamps.  A mid isthmic portion of tube was then suture ligated twice using a 0 chromic wheel.  The intervening knuckle of tube was then excised.  The tube was noted to be hemostatic before returning it to the abdomen.  The mini-lap was removed and the patient table was tilted to the right with the same procedure being repeated for the patient's left tube.    The previously placed stay sutures were tied together, no fascial defects were noted after closure.  The skin was closed using a 4-0 Monocryl in a subcuticular fashion.  The incision was then dressed with surgical skin glue.  Sponge needle and instrument counts were correct time two.  The patient tolerated the procedure well and was taken to the recovery room in stable condition.

## 2020-03-24 NOTE — Progress Notes (Signed)
Patient discharged with infant. Discharge instructions, prescriptions, and follow up appointments given to and reviewed with patient. Patient verbalized understanding. Escorted out by axillary.  

## 2020-03-24 NOTE — Transfer of Care (Signed)
Immediate Anesthesia Transfer of Care Note  Patient: Tracey Christensen  Procedure(s) Performed: POST PARTUM TUBAL LIGATION (Bilateral Abdomen)  Patient Location: PACU  Anesthesia Type:Spinal  Level of Consciousness: awake and patient cooperative  Airway & Oxygen Therapy: Patient Spontanous Breathing  Post-op Assessment: Report given to RN and Post -op Vital signs reviewed and stable  Post vital signs: Reviewed and stable  Last Vitals:  Vitals Value Taken Time  BP 100/66 03/24/20 1304  Temp    Pulse 83 03/24/20 1307  Resp 27 03/24/20 1307  SpO2 99 % 03/24/20 1307  Vitals shown include unvalidated device data.  Last Pain:  Vitals:   03/24/20 1118  TempSrc: Temporal  PainSc: 0-No pain      Patients Stated Pain Goal: 0 (03/23/20 2250)  Complications: No complications documented.

## 2020-03-24 NOTE — Anesthesia Preprocedure Evaluation (Signed)
Anesthesia Evaluation  Patient identified by MRN, date of birth, ID band Patient awake    Reviewed: Allergy & Precautions, H&P , NPO status , Patient's Chart, lab work & pertinent test results  History of Anesthesia Complications Negative for: history of anesthetic complications  Airway Mallampati: III       Dental no notable dental hx. (+) Dental Advidsory Given   Pulmonary neg pulmonary ROS,    Pulmonary exam normal        Cardiovascular Exercise Tolerance: Good hypertension (gestational), (-) angina(-) Past MI and (-) Cardiac Stents Normal cardiovascular exam(-) dysrhythmias (-) Valvular Problems/Murmurs     Neuro/Psych negative neurological ROS     GI/Hepatic negative GI ROS, Neg liver ROS,   Endo/Other  negative endocrine ROS  Renal/GU   negative genitourinary   Musculoskeletal   Abdominal   Peds  Hematology negative hematology ROS (+)   Anesthesia Other Findings Obese, BMI 39  Past Medical History: No date: Gestational hypertension  Past Surgical History: No date: HAND SURGERY; Right  BMI    Body Mass Index: 39.44 kg/m      Reproductive/Obstetrics (+) Breast feeding                              Anesthesia Physical  Anesthesia Plan  ASA: II  Anesthesia Plan: Spinal   Post-op Pain Management:    Induction:   PONV Risk Score and Plan: 2 and Midazolam  Airway Management Planned: Natural Airway and Simple Face Mask  Additional Equipment:   Intra-op Plan:   Post-operative Plan:   Informed Consent: I have reviewed the patients History and Physical, chart, labs and discussed the procedure including the risks, benefits and alternatives for the proposed anesthesia with the patient or authorized representative who has indicated his/her understanding and acceptance.       Plan Discussed with: Anesthesiologist  Anesthesia Plan Comments:         Anesthesia  Quick Evaluation

## 2020-03-24 NOTE — Anesthesia Postprocedure Evaluation (Signed)
Anesthesia Post Note  Patient: Tracey Christensen  Procedure(s) Performed: AN AD HOC LABOR EPIDURAL  Patient location during evaluation: Mother Baby Anesthesia Type: Epidural Level of consciousness: awake and alert Pain management: pain level controlled Vital Signs Assessment: post-procedure vital signs reviewed and stable Respiratory status: spontaneous breathing, nonlabored ventilation and respiratory function stable Cardiovascular status: stable Postop Assessment: no headache, no backache and epidural receding Anesthetic complications: no   No complications documented.   Last Vitals:  Vitals:   03/24/20 0321 03/24/20 0725  BP: 114/74 124/89  Pulse: 68 69  Resp: 20 20  Temp: 36.9 C 37 C  SpO2: 96% 100%    Last Pain:  Vitals:   03/24/20 0730  TempSrc:   PainSc: 0-No pain                 Laken Rog Joanette Gula

## 2020-03-24 NOTE — Interval H&P Note (Signed)
History and Physical Interval Note:  03/24/2020 12:00 PM  Tracey Christensen  has presented today for surgery, with the diagnosis of Desires sterilization.  The various methods of treatment have been discussed with the patient and family. After consideration of risks, benefits and other options for treatment, the patient has consented to  Procedure(s): POST PARTUM TUBAL LIGATION (Bilateral) as a surgical intervention.  The patient's history has been reviewed, patient examined, no change in status, stable for surgery.  I have reviewed the patient's chart and labs.  Questions were answered to the patient's satisfaction.     Vena Austria

## 2020-03-24 NOTE — Progress Notes (Signed)
Patient transported to OR via bed.

## 2020-03-24 NOTE — Anesthesia Procedure Notes (Signed)
Spinal  Patient location during procedure: OR Start time: 03/24/2020 12:17 PM End time: 03/24/2020 12:20 PM Staffing Performed: resident/CRNA  Resident/CRNA: Lynden Oxford, CRNA Preanesthetic Checklist Completed: patient identified, IV checked, site marked, risks and benefits discussed, surgical consent, monitors and equipment checked, pre-op evaluation and timeout performed Spinal Block Patient position: sitting Prep: DuraPrep Patient monitoring: heart rate, cardiac monitor, continuous pulse ox and blood pressure Approach: midline Location: L3-4 Injection technique: single-shot Needle Needle type: Pencan  Needle gauge: 25 G Needle length: 9 cm Assessment Sensory level: T4

## 2020-03-24 NOTE — Discharge Instructions (Signed)
Laparoscopic Tubal Ligation, Care After The following information offers guidance on how to care for yourself after your procedure. Your health care provider may also give you more specific instructions. If you have problems or questions, contact your health care provider. What can I expect after the procedure? After the procedure, it is common to have:  A sore throat if general anesthesia was used.  Pain in shoulders, back, and abdomen. This is caused by the gas that was used during the procedure.  Mild discomfort or cramping in your abdomen.  Pain or soreness in the area where the surgical incision was made.  A bloated feeling.  Tiredness.  Nausea and vomiting. Follow these instructions at home: Medicines  Take over-the-counter and prescription medicines only as told by your health care provider.  Ask your health care provider if the medicine prescribed to you: ? Requires you to avoid driving or using heavy machinery. ? Can cause constipation. You may need to take these actions to prevent or treat constipation:  Drink enough fluid to keep your urine pale yellow.  Take over-the-counter or prescription medicines.  Eat foods that are high in fiber, such as beans, whole grains, and fresh fruits and vegetables.  Limit foods that are high in fat and processed sugars, such as fried or sweet foods.  Do not take aspirin because it can cause bleeding. Incision care  Follow instructions from your health care provider about how to take care of your incision. Make sure you: ? Wash your hands with soap and water for at least 20 seconds before and after you change your bandage (dressing). If soap and water are not available, use hand sanitizer. ? Change your dressing as told by your health care provider. ? Leave stitches (sutures), skin glue, or adhesive strips in place. These skin closures may need to stay in place for 2 weeks or longer. If adhesive strip edges start to loosen and curl  up, you may trim the loose edges. Do not remove adhesive strips completely unless your health care provider tells you to do that.  Check your incision area every day for signs of infection. Check for: ? Redness, swelling, or more pain. ? Fluid or blood. ? Warmth. ? Pus or a bad smell.      Activity  Rest as told by your health care provider.  Avoid sitting for a long time without moving. Get up to take short walks every 1-2 hours. This is important to improve blood flow and breathing. Ask for help if you feel weak or unsteady.  Do not have sex, douche, or put a tampon or anything else in your vagina for 6 weeks or as long as told by your health care provider.  Do not lift anything that is heavier than your baby for 2 weeks, or the limit that you are told, until your health care provider says that it is safe.  Do not take baths, swim, or use a hot tub until your health care provider approves. Ask your health care provider if you may take showers. You may only be allowed to take sponge baths.  Return to your normal activities as told by your health care provider. Ask your health care provider what activities are safe for you. General instructions  After the procedure you may need to wear a sanitary pad for vaginal discharge.  Have someone help you with your daily household tasks for the first few days.  Keep all follow-up visits. This is important. Contact a health   care provider if:  You have redness, swelling, or more pain around your incision.  Your incision feels warm to the touch.  You have pus or a bad smell coming from your incision.  The edges of your incision break open after the sutures have been removed.  Your pain does not improve after 2-3 days.  You have a rash.  You repeatedly become dizzy or light-headed.  Your pain medicine is not helping. Get help right away if:  You have a fever or chills.  You faint.  You have increasing pain in your  abdomen.  You have severe pain in one or both of your shoulders.  You have fluid or blood coming from your sutures or heavy bleeding from your vagina.  You have shortness of breath or difficulty breathing.  You have chest pain, leg pain, or leg swelling.  You have ongoing nausea, vomiting, or diarrhea. These symptoms may represent a serious problem that is an emergency. Do not wait to see if the symptoms will go away. Get medical help right away. Call your local emergency services (911 in the [place] Do not drive yourself to the hospital. Summary  After the procedure, it is common to have mild discomfort or cramping in your abdomen.  After the procedure you may need to wear a sanitary pad for vaginal discharge.  Take over-the-counter and prescription medicines only as told by your health care provider.  Watch for symptoms that should prompt you to call your health care provider.  Keep all follow-up visits. This is important. This information is not intended to replace advice given to you by your health care provider. Make sure you discuss any questions you have with your health care provider. Document Revised: 09/17/2019 Document Reviewed: 09/17/2019 Elsevier Patient Education  [employer]. Postpartum Care After Vaginal Delivery The following information offers guidance about how to care for yourself from the time you deliver your baby to 6-12 weeks after delivery (postpartum period). If you have problems or questions, contact your health care provider for more specific instructions. Follow these instructions at home: Vaginal bleeding  It is normal to have vaginal bleeding (lochia) after delivery. Wear a sanitary pad for bleeding and discharge. ? During the first week after delivery, the amount and appearance of lochia is often similar to a menstrual period. ? Over the next few weeks, it will gradually decrease to a dry, yellow-brown discharge. ? For most women, lochia stops  completely by 4-6 weeks after delivery, but can vary.  Change your sanitary pads frequently. Watch for any changes in your flow, such as: ? A sudden increase in volume. ? A change in color. ? Large blood clots.  If you pass a blood clot from your vagina, save it and call your health care provider. Do not flush blood clots down the toilet before talking with your health care provider.  Do not use tampons or douches until your health care provider approves.  If you are not breastfeeding, your period should return 6-8 weeks after delivery. If you are feeding your baby breast milk only, your period may not return until you stop breastfeeding. Perineal care  Keep the area between the vagina and the anus (perineum) clean and dry. Use medicated pads and pain-relieving sprays and creams as directed.  If you had a surgical cut in the perineum (episiotomy) or a tear, check the area for signs of infection until you are healed. Check for: ? More redness, swelling, or pain. ? Fluid or  blood coming from the cut or tear. ? Warmth. ? Pus or a bad smell.  You may be given a squirt bottle to use instead of wiping to clean the perineum area after you use the bathroom. Pat the area gently to dry it.  To relieve pain caused by an episiotomy, a tear, or swollen veins in the anus (hemorrhoids), take a warm sitz bath 2-3 times a day. In a sitz bath, the warm water should only come up to your hips and cover your buttocks.   Breast care  In the first few days after delivery, your breasts may feel heavy, full, and uncomfortable (breast engorgement). Milk may also leak from your breasts. Ask your health care provider about ways to help relieve the discomfort.  If you are breastfeeding: ? Wear a bra that supports your breasts and fits well. Use breast pads to absorb milk that leaks. ? Keep your nipples clean and dry. Apply creams and ointments as told. ? You may have uterine contractions every time you breastfeed  for up to several weeks after delivery. This helps your uterus return to its normal size. ? If you have any problems with breastfeeding, notify your health care provider or lactation consultant.  If you are not breastfeeding: ? Avoid touching your breasts. Do not squeeze out (express) milk. Doing this can make your breasts produce more milk. ? Wear a good-fitting bra and use cold packs to help with swelling. Intimacy and sexuality  Ask your health care provider when you can engage in sexual activity. This may depend upon: ? Your risk of infection. ? How fast you are healing. ? Your comfort and desire to engage in sexual activity.  You are able to get pregnant after delivery, even if you have not had your period. Talk with your health care provider about methods of birth control (contraception) or family planning if you desire future pregnancies. Medicines  Take over-the-counter and prescription medicines only as told by your health care provider.  Take an over-the-counter stool softener to help ease bowel movements as told by your health care provider.  If you were prescribed an antibiotic medicine, take it as told by your health care provider. Do not stop taking the antibiotic even if you start to feel better.  Review all previous and current prescriptions to check for possible transfer into breast milk. Activity  Gradually return to your normal activities as told by your health care provider.  Rest as much as possible. Nap while your baby is sleeping. Eating and drinking  Drink enough fluid to keep your urine pale yellow.  To help prevent or relieve constipation, eat high-fiber foods every day.  Choose healthy eating to support breastfeeding or weight loss goals.  Take your prenatal vitamins until your health care provider tells you to stop.   General tips/recommendations  Do not use any products that contain nicotine or tobacco. These products include cigarettes, chewing  tobacco, and vaping devices, such as e-cigarettes. If you need help quitting, ask your health care provider.  Do not drink alcohol, especially if you are breastfeeding.  Do not take medications or drugs that are not prescribed to you, especially if you are breastfeeding.  Visit your health care provider for a postpartum checkup within the first 3-6 weeks after delivery.  Complete a comprehensive postpartum visit no later than 12 weeks after delivery.  Keep all follow-up visits for you and your baby. Contact a health care provider if:  You feel unusually sad or  worried.  Your breasts become red, painful, or hard.  You have a fever or other signs of an infection.  You have bleeding that is soaking through one pad an hour or you have blood clots.  You have a severe headache that doesn't go away or you have vision changes.  You have nausea and vomiting and are unable to eat or drink anything for 24 hours. Get help right away if:  You have chest pain or difficulty breathing.  You have sudden, severe leg pain.  You faint or have a seizure.  You have thoughts about hurting yourself or your baby. If you ever feel like you may hurt yourself or others, or have thoughts about taking your own life, get help right away. Go to your nearest emergency department or:  Call your local emergency services (911 in the [place]  The National Suicide Prevention Lifeline at 424-062-0380. This suicide crisis helpline is open 24 hours a day.  Text the Crisis Text Line at 321-109-4910 (in the [place] Summary  The period of time after you deliver your newborn up to 6-12 weeks after delivery is called the postpartum period.  Keep all follow-up visits for you and your baby.  Review all previous and current prescriptions to check for possible transfer into breast milk.  Contact a health care provider if you feel unusually sad or worried during the postpartum period. This information is not intended to  replace advice given to you by your health care provider. Make sure you discuss any questions you have with your health care provider. Document Revised: 09/16/2019 Document Reviewed: 09/16/2019 Elsevier Patient Education  2021 [employer] Discharge Instructions:   Follow-up Appointment: 1 week, please call the office to schedule  If there are any new medications, they have been ordered and will be available for pickup at the listed pharmacy on your way home from the hospital.   Call the office if you have any of the following: headache, visual changes, fever >101.0 F, chills, shortness of breath, breast concerns, excessive vaginal bleeding, incision drainage or problems, leg pain or redness, depression or any other concerns. If you have vaginal discharge with an odor, let your doctor know.   It is normal to bleed for up to 6 weeks. You should not soak through more than 1 pad in 1 hour. If you have a blood clot larger than your fist with continued bleeding, call your doctor.   Activity: Do not lift > 15 lbs for 6 weeks (do not lift anything heavier than your baby). No intercourse, tampons, swimming pools, hot tubs, baths (only showers) for 6 weeks.  No driving for 1-2 weeks. Do not drive while taking narcotic or opioid pain medication.  Continue taking your prenatal vitamin, especially if breastfeeding. Increase calories and fluids (water) while breastfeeding.   Your milk will come in, in the next couple of days (right now it is colostrum). You may have a slight fever when your milk comes in, but it should go away on its own.  If it does not, and rises above 101 F please call the doctor. You will also feel achy and your breasts will be firm. They will also start to leak. If you are breastfeeding, continue as you have been and you can pump/express milk for comfort.   If you have too much milk, your breasts can become engorged, which could lead to mastitis. This is an infection of the milk  ducts. It can be very painful and you will  need to notify your doctor to obtain a prescription for antibiotics. You can also treat it with a shower or hot/cold compress.   For concerns about your baby, please call your pediatrician.  For breastfeeding concerns, the lactation consultant can be reached at [phone].   Postpartum blues (feelings of happy one minute and sad another minute) are normal for the first few weeks but if it gets worse let your doctor know.   Congratulations! We enjoyed caring for you and your new bundle of joy!

## 2020-03-24 NOTE — Progress Notes (Signed)
Subjective:  Doing well no concerns, moderate lochia.  No fevers, no chills.   Objective:  Vital signs in last 24 hours: Temp:  [98.1 F (36.7 C)-99 F (37.2 C)] 98.6 F (37 C) (03/11 0725) Pulse Rate:  [68-108] 69 (03/11 0725) Resp:  [16-20] 20 (03/11 0725) BP: (90-130)/(42-89) 124/89 (03/11 0725) SpO2:  [96 %-100 %] 100 % (03/11 0725) Weight:  [107.5 kg] 107.5 kg (03/10 0835)    General: NAD Pulmonary: no increased work of breathing Abdomen: non-distended, non-tender, fundus firm at level of umbilicus Extremities: no edema, no erythema, no tenderness  Results for orders placed or performed during the hospital encounter of 03/23/20 (from the past 72 hour(s))  CBC     Status: Abnormal   Collection Time: 03/23/20  8:54 AM  Result Value Ref Range   WBC 7.4 4.0 - 10.5 K/uL   RBC 4.22 3.87 - 5.11 MIL/uL   Hemoglobin 10.8 (L) 12.0 - 15.0 g/dL   HCT 93.8 (L) 10.1 - 75.1 %   MCV 78.7 (L) 80.0 - 100.0 fL   MCH 25.6 (L) 26.0 - 34.0 pg   MCHC 32.5 30.0 - 36.0 g/dL   RDW 02.5 85.2 - 77.8 %   Platelets 243 150 - 400 K/uL   nRBC 0.0 0.0 - 0.2 %    Comment: Performed at [facility], [address] [place], [place] [address]  Type and screen     Status: None   Collection Time: 03/23/20  8:54 AM  Result Value Ref Range   ABO/RH(D) A POS    Antibody Screen NEG    Sample Expiration      03/26/2020,2359 Performed at [facility], [address] [place], [place] [address]   Protein / creatinine ratio, urine     Status: None   Collection Time: 03/23/20  8:54 AM  Result Value Ref Range   Creatinine, Urine 115 mg/dL   Total Protein, Urine 16 mg/dL    Comment: NO NORMAL RANGE ESTABLISHED FOR THIS TEST   Protein Creatinine Ratio 0.14 0.00 - 0.15 mg/mg[Cre]    Comment: Performed at [facility], [address] [place], [place] [address]  Comprehensive metabolic panel     Status: Abnormal   Collection Time: 03/23/20  8:54 AM  Result Value Ref Range    Sodium 136 135 - 145 mmol/L   Potassium 3.7 3.5 - 5.1 mmol/L   Chloride 108 98 - 111 mmol/L   CO2 20 (L) 22 - 32 mmol/L   Glucose, Bld 85 70 - 99 mg/dL    Comment: Glucose reference range applies only to samples taken after fasting for at least 8 hours.   BUN <5 (L) 6 - 20 mg/dL   Creatinine, Ser 0.86 0.44 - 1.00 mg/dL   Calcium 8.5 (L) 8.9 - 10.3 mg/dL   Total Protein 6.6 6.5 - 8.1 g/dL   Albumin 2.8 (L) 3.5 - 5.0 g/dL   AST 13 (L) 15 - 41 U/L   ALT 9 0 - 44 U/L   Alkaline Phosphatase 112 38 - 126 U/L   Total Bilirubin 0.5 0.3 - 1.2 mg/dL   GFR, Estimated >76 >19 mL/min    Comment: (NOTE) Calculated using the CKD-EPI Creatinine Equation (2021)    Anion gap 8 5 - 15    Comment: Performed at [facility], [address], [place], [place] [address]  ABO/Rh     Status: None   Collection Time: 03/23/20 10:48 AM  Result Value  Ref Range   ABO/RH(D)      A POS Performed at [facility], [address] [place], [place] [address]   CBC     Status: Abnormal   Collection Time: 03/24/20  5:25 AM  Result Value Ref Range   WBC 11.2 (H) 4.0 - 10.5 K/uL   RBC 3.82 (L) 3.87 - 5.11 MIL/uL   Hemoglobin 9.8 (L) 12.0 - 15.0 g/dL   HCT 76.2 (L) 83.1 - 51.7 %   MCV 77.7 (L) 80.0 - 100.0 fL   MCH 25.7 (L) 26.0 - 34.0 pg   MCHC 33.0 30.0 - 36.0 g/dL   RDW 61.6 07.3 - 71.0 %   Platelets 207 150 - 400 K/uL   nRBC 0.0 0.0 - 0.2 %    Comment: Performed at [facility], [address] [place], [place] [address]    Assessment:   30 y.o. [phone] postpartum day # 1, postpartum tubal ligation scheduled for today  Plan:    1) Acute blood loss anemia - hemodynamically stable and asymptomatic - po ferrous sulfate  2) Blood Type --/--/A POS Performed at [facility], [address] [place], [place] [address]  [phone]/10 [phone]) / Rubella 2.14 (08/09 1153) / Varicella Immune  3) TDAP status up to date  4) Feeding plan bottle  5)  Education given regarding  options for contraception, as well as compatibility with breast feeding if applicable.  Patient plans on tubal ligation for contraception.  30 y.o. [id number]  with undesired fertility, desires permanent sterilization.  Other reversible forms of contraception were discussed with patient; she declines all other modalities. Permanent nature of as well as associated risks of the procedure discussed with patient including but not limited to: risk of regret, permanence of method, bleeding, infection, injury to surrounding organs and need for additional procedures.  Failure risk of 0.5-1% with increased risk of ectopic gestation if pregnancy occurs was also discussed with patient.    6) Disposition  Vena Austria, MD, [facility], [facility] 03/24/2020, 7:57 AM

## 2020-03-27 ENCOUNTER — Encounter: Payer: Self-pay | Admitting: Obstetrics and Gynecology

## 2020-03-27 LAB — SURGICAL PATHOLOGY

## 2020-03-29 ENCOUNTER — Emergency Department: Payer: Medicaid Other | Admitting: Anesthesiology

## 2020-03-29 ENCOUNTER — Encounter
Admission: EM | Disposition: A | Discharge status: Home / Self Care | Payer: Self-pay | Source: Home / Self Care | Attending: Emergency Medicine

## 2020-03-29 ENCOUNTER — Emergency Department: Payer: Medicaid Other

## 2020-03-29 ENCOUNTER — Other Ambulatory Visit: Payer: Self-pay

## 2020-03-29 ENCOUNTER — Observation Stay
Admission: EM | Admit: 2020-03-29 | Discharge: 2020-03-30 | DRG: 769 | Disposition: A | Discharge status: Home / Self Care | Payer: Medicaid Other | Attending: Obstetrics and Gynecology | Admitting: Obstetrics and Gynecology

## 2020-03-29 DIAGNOSIS — Z833 Family history of diabetes mellitus: Secondary | ICD-10-CM | POA: Diagnosis not present

## 2020-03-29 DIAGNOSIS — Z20822 Contact with and (suspected) exposure to covid-19: Secondary | ICD-10-CM | POA: Diagnosis present

## 2020-03-29 DIAGNOSIS — Z8249 Family history of ischemic heart disease and other diseases of the circulatory system: Secondary | ICD-10-CM

## 2020-03-29 DIAGNOSIS — D62 Acute posthemorrhagic anemia: Secondary | ICD-10-CM | POA: Diagnosis not present

## 2020-03-29 DIAGNOSIS — N939 Abnormal uterine and vaginal bleeding, unspecified: Secondary | ICD-10-CM

## 2020-03-29 HISTORY — PX: DILATION AND EVACUATION: SHX1459

## 2020-03-29 LAB — CBC WITH DIFFERENTIAL/PLATELET
Abs Immature Granulocytes: 0.02 10*3/uL (ref 0.00–0.07)
Basophils Absolute: 0 10*3/uL (ref 0.0–0.1)
Basophils Relative: 1 %
Eosinophils Absolute: 0.2 10*3/uL (ref 0.0–0.5)
Eosinophils Relative: 3 %
HCT: 29.2 % — ABNORMAL LOW (ref 36.0–46.0)
Hemoglobin: 9.4 g/dL — ABNORMAL LOW (ref 12.0–15.0)
Immature Granulocytes: 0 %
Lymphocytes Relative: 28 %
Lymphs Abs: 1.7 10*3/uL (ref 0.7–4.0)
MCH: 25.5 pg — ABNORMAL LOW (ref 26.0–34.0)
MCHC: 32.2 g/dL (ref 30.0–36.0)
MCV: 79.1 fL — ABNORMAL LOW (ref 80.0–100.0)
Monocytes Absolute: 0.4 10*3/uL (ref 0.1–1.0)
Monocytes Relative: 7 %
Neutro Abs: 3.6 10*3/uL (ref 1.7–7.7)
Neutrophils Relative %: 61 %
Platelets: 179 10*3/uL (ref 150–400)
RBC: 3.69 MIL/uL — ABNORMAL LOW (ref 3.87–5.11)
RDW: 14.4 % (ref 11.5–15.5)
WBC: 6 10*3/uL (ref 4.0–10.5)
nRBC: 0.3 % — ABNORMAL HIGH (ref 0.0–0.2)

## 2020-03-29 LAB — HEMOGLOBIN AND HEMATOCRIT, BLOOD
HCT: 27.6 % — ABNORMAL LOW (ref 36.0–46.0)
Hemoglobin: 8.9 g/dL — ABNORMAL LOW (ref 12.0–15.0)

## 2020-03-29 LAB — PREPARE RBC (CROSSMATCH)

## 2020-03-29 LAB — CBC
HCT: 22.7 % — ABNORMAL LOW (ref 36.0–46.0)
HCT: 21.9 % — ABNORMAL LOW (ref 36.0–46.0)
HCT: 19.8 % — ABNORMAL LOW (ref 36.0–46.0)
Hemoglobin: 7.5 g/dL — ABNORMAL LOW (ref 12.0–15.0)
Hemoglobin: 7.4 g/dL — ABNORMAL LOW (ref 12.0–15.0)
Hemoglobin: 6.6 g/dL — ABNORMAL LOW (ref 12.0–15.0)
MCH: 27 pg (ref 26.0–34.0)
MCH: 26.7 pg (ref 26.0–34.0)
MCH: 26.6 pg (ref 26.0–34.0)
MCHC: 33 g/dL (ref 30.0–36.0)
MCHC: 33.8 g/dL (ref 30.0–36.0)
MCHC: 33.3 g/dL (ref 30.0–36.0)
MCV: 81.7 fL (ref 80.0–100.0)
MCV: 79.1 fL — ABNORMAL LOW (ref 80.0–100.0)
MCV: 79.8 fL — ABNORMAL LOW (ref 80.0–100.0)
Platelets: 164 10*3/uL (ref 150–400)
Platelets: 150 10*3/uL (ref 150–400)
Platelets: 168 10*3/uL (ref 150–400)
RBC: 2.78 MIL/uL — ABNORMAL LOW (ref 3.87–5.11)
RBC: 2.77 MIL/uL — ABNORMAL LOW (ref 3.87–5.11)
RBC: 2.48 MIL/uL — ABNORMAL LOW (ref 3.87–5.11)
RDW: 14 % (ref 11.5–15.5)
RDW: 13.9 % (ref 11.5–15.5)
RDW: 14 % (ref 11.5–15.5)
WBC: 17.2 10*3/uL — ABNORMAL HIGH (ref 4.0–10.5)
WBC: 11.8 10*3/uL — ABNORMAL HIGH (ref 4.0–10.5)
WBC: 9.3 10*3/uL (ref 4.0–10.5)
nRBC: 0 % (ref 0.0–0.2)
nRBC: 0 % (ref 0.0–0.2)
nRBC: 0 % (ref 0.0–0.2)

## 2020-03-29 LAB — RESP PANEL BY RT-PCR (FLU A&B, COVID) ARPGX2
Influenza A by PCR: NEGATIVE
Influenza B by PCR: NEGATIVE
SARS Coronavirus 2 by RT PCR: NEGATIVE

## 2020-03-29 LAB — BASIC METABOLIC PANEL
Anion gap: 6 (ref 5–15)
BUN: 16 mg/dL (ref 6–20)
CO2: 23 mmol/L (ref 22–32)
Calcium: 8.4 mg/dL — ABNORMAL LOW (ref 8.9–10.3)
Chloride: 109 mmol/L (ref 98–111)
Creatinine, Ser: 0.94 mg/dL (ref 0.44–1.00)
GFR, Estimated: >60 mL/min (ref >60)
Glucose, Bld: 95 mg/dL (ref 70–99)
Potassium: 4.1 mmol/L (ref 3.5–5.1)
Sodium: 138 mmol/L (ref 135–145)

## 2020-03-29 LAB — HCG, QUANTITATIVE, PREGNANCY: hCG, Beta Chain, Quant, S: 155 m[IU]/mL — ABNORMAL HIGH (ref <5)

## 2020-03-29 SURGERY — DILATION AND EVACUATION, UTERUS
Anesthesia: General | Site: Vagina

## 2020-03-29 MED ORDER — ALBUMIN HUMAN 5 % IV SOLN: INTRAVENOUS | Status: DC | PRN

## 2020-03-29 MED ORDER — DEXAMETHASONE SODIUM PHOSPHATE 10 MG/ML IJ SOLN
INTRAMUSCULAR | Status: AC
  Filled 2020-03-29: qty 1

## 2020-03-29 MED ORDER — OXYCODONE-ACETAMINOPHEN 5-325 MG PO TABS
1.0000 | ORAL_TABLET | ORAL | Status: DC | PRN
  Administered 2020-03-29 (×2): 2 via ORAL
  Filled 2020-03-29 (×2): qty 2

## 2020-03-29 MED ORDER — VASOPRESSIN 20 UNIT/ML IV SOLN
INTRAVENOUS | Status: AC
  Filled 2020-03-29: qty 1

## 2020-03-29 MED ORDER — SODIUM CHLORIDE 0.9% IV SOLUTION: Freq: Once | INTRAVENOUS | Status: AC

## 2020-03-29 MED ORDER — BISACODYL 10 MG RE SUPP
10.0000 mg | Freq: Every day | RECTAL | Status: DC | PRN
Start: 2020-03-29 — End: 2020-03-30
  Filled 2020-03-29: qty 1

## 2020-03-29 MED ORDER — SUCCINYLCHOLINE CHLORIDE 200 MG/10ML IV SOSY
PREFILLED_SYRINGE | INTRAVENOUS | Status: AC
  Filled 2020-03-29: qty 10

## 2020-03-29 MED ORDER — MIDAZOLAM HCL 2 MG/2ML IJ SOLN
INTRAMUSCULAR | Status: AC
  Filled 2020-03-29: qty 2

## 2020-03-29 MED ORDER — ONDANSETRON HCL 4 MG/2ML IJ SOLN
INTRAMUSCULAR | Status: DC | PRN
  Administered 2020-03-29: 4 mg via INTRAVENOUS

## 2020-03-29 MED ORDER — PROPOFOL 10 MG/ML IV BOLUS
INTRAVENOUS | Status: AC
  Filled 2020-03-29: qty 20

## 2020-03-29 MED ORDER — CALCIUM CHLORIDE 10 % IV SOLN
INTRAVENOUS | Status: AC
  Filled 2020-03-29: qty 10

## 2020-03-29 MED ORDER — ALBUMIN HUMAN 5 % IV SOLN
INTRAVENOUS | Status: AC
  Filled 2020-03-29: qty 250

## 2020-03-29 MED ORDER — SIMETHICONE 80 MG PO CHEW: 80.0000 mg | CHEWABLE_TABLET | Freq: Four times a day (QID) | ORAL | Status: DC | PRN

## 2020-03-29 MED ORDER — SUGAMMADEX SODIUM 200 MG/2ML IV SOLN
INTRAVENOUS | Status: DC | PRN
  Administered 2020-03-29: 250 mg via INTRAVENOUS

## 2020-03-29 MED ORDER — PHENYLEPHRINE HCL (PRESSORS) 10 MG/ML IV SOLN
INTRAVENOUS | Status: DC | PRN
  Administered 2020-03-29: 100 ug via INTRAVENOUS

## 2020-03-29 MED ORDER — OXYCODONE HCL 5 MG/5ML PO SOLN: 5.0000 mg | Freq: Once | ORAL | Status: DC | PRN

## 2020-03-29 MED ORDER — ACETAMINOPHEN 10 MG/ML IV SOLN
INTRAVENOUS | Status: AC
  Filled 2020-03-29: qty 100

## 2020-03-29 MED ORDER — LIDOCAINE HCL (CARDIAC) PF 100 MG/5ML IV SOSY
PREFILLED_SYRINGE | INTRAVENOUS | Status: DC | PRN
  Administered 2020-03-29: 100 mg via INTRAVENOUS

## 2020-03-29 MED ORDER — FENTANYL CITRATE (PF) 100 MCG/2ML IJ SOLN
INTRAMUSCULAR | Status: DC | PRN
  Administered 2020-03-29 (×4): 50 ug via INTRAVENOUS

## 2020-03-29 MED ORDER — SENNOSIDES-DOCUSATE SODIUM 8.6-50 MG PO TABS: 1.0000 | ORAL_TABLET | Freq: Every evening | ORAL | Status: DC | PRN

## 2020-03-29 MED ORDER — ROCURONIUM BROMIDE 100 MG/10ML IV SOLN
INTRAVENOUS | Status: DC | PRN
  Administered 2020-03-29: 10 mg via INTRAVENOUS

## 2020-03-29 MED ORDER — KETOROLAC TROMETHAMINE 30 MG/ML IJ SOLN: 30.0000 mg | Freq: Four times a day (QID) | INTRAMUSCULAR | Status: AC

## 2020-03-29 MED ORDER — SUGAMMADEX SODIUM 500 MG/5ML IV SOLN
INTRAVENOUS | Status: AC
  Filled 2020-03-29: qty 5

## 2020-03-29 MED ORDER — VASOPRESSIN 20 UNIT/ML IV SOLN
INTRAVENOUS | Status: DC | PRN
  Administered 2020-03-29 (×3): 1 [IU] via INTRAVENOUS

## 2020-03-29 MED ORDER — MIDAZOLAM HCL 2 MG/2ML IJ SOLN
INTRAMUSCULAR | Status: DC | PRN
  Administered 2020-03-29: 2 mg via INTRAVENOUS

## 2020-03-29 MED ORDER — DOCUSATE SODIUM 100 MG PO CAPS
100.0000 mg | ORAL_CAPSULE | Freq: Two times a day (BID) | ORAL | Status: DC
  Filled 2020-03-29: qty 1

## 2020-03-29 MED ORDER — PROPOFOL 10 MG/ML IV BOLUS
INTRAVENOUS | Status: DC | PRN
  Administered 2020-03-29: 150 mg via INTRAVENOUS

## 2020-03-29 MED ORDER — EPHEDRINE SULFATE 50 MG/ML IJ SOLN
INTRAMUSCULAR | Status: DC | PRN
  Administered 2020-03-29: 10 mg via INTRAVENOUS

## 2020-03-29 MED ORDER — ACETAMINOPHEN 10 MG/ML IV SOLN: 1000.0000 mg | Freq: Once | INTRAVENOUS | Status: DC | PRN

## 2020-03-29 MED ORDER — DIPHENHYDRAMINE HCL 50 MG/ML IJ SOLN
25.0000 mg | Freq: Once | INTRAMUSCULAR | Status: AC
  Administered 2020-03-29: 25 mg via INTRAVENOUS

## 2020-03-29 MED ORDER — FENTANYL CITRATE (PF) 100 MCG/2ML IJ SOLN
INTRAMUSCULAR | Status: AC
  Filled 2020-03-29: qty 2

## 2020-03-29 MED ORDER — LACTATED RINGERS IV SOLN: INTRAVENOUS | Status: DC | PRN

## 2020-03-29 MED ORDER — KETOROLAC TROMETHAMINE 30 MG/ML IJ SOLN
INTRAMUSCULAR | Status: AC
  Filled 2020-03-29: qty 1

## 2020-03-29 MED ORDER — SUCCINYLCHOLINE CHLORIDE 20 MG/ML IJ SOLN
INTRAMUSCULAR | Status: DC | PRN
  Administered 2020-03-29: 100 mg via INTRAVENOUS

## 2020-03-29 MED ORDER — ONDANSETRON HCL 4 MG PO TABS
4.0000 mg | ORAL_TABLET | Freq: Four times a day (QID) | ORAL | Status: DC | PRN
  Filled 2020-03-29: qty 1

## 2020-03-29 MED ORDER — ACETAMINOPHEN 325 MG PO TABS
650.0000 mg | ORAL_TABLET | ORAL | Status: DC | PRN
  Administered 2020-03-30 (×2): 650 mg via ORAL
  Filled 2020-03-29 (×2): qty 2

## 2020-03-29 MED ORDER — SODIUM CHLORIDE (PF) 0.9 % IJ SOLN
INTRAMUSCULAR | Status: AC
  Filled 2020-03-29: qty 50

## 2020-03-29 MED ORDER — LACTATED RINGERS IV SOLN: INTRAVENOUS | Status: DC

## 2020-03-29 MED ORDER — DEXAMETHASONE SODIUM PHOSPHATE 10 MG/ML IJ SOLN
INTRAMUSCULAR | Status: DC | PRN
  Administered 2020-03-29: 10 mg via INTRAVENOUS

## 2020-03-29 MED ORDER — LIDOCAINE HCL (PF) 2 % IJ SOLN
INTRAMUSCULAR | Status: AC
  Filled 2020-03-29: qty 5

## 2020-03-29 MED ORDER — DIPHENHYDRAMINE HCL 50 MG/ML IJ SOLN
INTRAMUSCULAR | Status: AC
  Filled 2020-03-29: qty 1

## 2020-03-29 MED ORDER — ONDANSETRON HCL 4 MG/2ML IJ SOLN
INTRAMUSCULAR | Status: AC
  Filled 2020-03-29: qty 2

## 2020-03-29 MED ORDER — CALCIUM CHLORIDE 10 % IV SOLN
INTRAVENOUS | Status: DC | PRN
  Administered 2020-03-29: .5 g via INTRAVENOUS

## 2020-03-29 MED ORDER — ONDANSETRON HCL 4 MG/2ML IJ SOLN: 4.0000 mg | Freq: Four times a day (QID) | INTRAMUSCULAR | Status: DC | PRN

## 2020-03-29 MED ORDER — SODIUM CHLORIDE 0.9 % IV BOLUS (SEPSIS)
1000.0000 mL | Freq: Once | INTRAVENOUS | Status: AC
  Administered 2020-03-29: 1000 mL via INTRAVENOUS

## 2020-03-29 MED ORDER — METHYLERGONOVINE MALEATE 0.2 MG/ML IJ SOLN
0.2000 mg | Freq: Once | INTRAMUSCULAR | Status: AC
  Administered 2020-03-29: 0.2 mg via INTRAMUSCULAR
  Filled 2020-03-29: qty 1

## 2020-03-29 MED ORDER — FENTANYL CITRATE (PF) 100 MCG/2ML IJ SOLN: 25.0000 ug | INTRAMUSCULAR | Status: DC | PRN

## 2020-03-29 MED ORDER — OXYCODONE HCL 5 MG PO TABS: 5.0000 mg | ORAL_TABLET | Freq: Once | ORAL | Status: DC | PRN

## 2020-03-29 MED ORDER — KETOROLAC TROMETHAMINE 30 MG/ML IJ SOLN
INTRAMUSCULAR | Status: DC | PRN
  Administered 2020-03-29: 30 mg via INTRAVENOUS

## 2020-03-29 MED ORDER — ONDANSETRON HCL 4 MG/2ML IJ SOLN: 4.0000 mg | Freq: Once | INTRAMUSCULAR | Status: DC | PRN

## 2020-03-29 MED ORDER — SILVER NITRATE-POT NITRATE 75-25 % EX MISC
CUTANEOUS | Status: AC
  Filled 2020-03-29: qty 10

## 2020-03-29 MED ORDER — ALBUMIN HUMAN 5 % IV SOLN
INTRAVENOUS | Status: AC
  Filled 2020-03-29: qty 500

## 2020-03-29 MED ORDER — MORPHINE SULFATE (PF) 2 MG/ML IV SOLN: 1.0000 mg | INTRAVENOUS | Status: DC | PRN

## 2020-03-29 MED ORDER — ACETAMINOPHEN 10 MG/ML IV SOLN
INTRAVENOUS | Status: DC | PRN
  Administered 2020-03-29: 1000 mg via INTRAVENOUS

## 2020-03-29 MED ORDER — SODIUM CHLORIDE 0.9 % IV SOLN
INTRAVENOUS | Status: DC | PRN
  Administered 2020-03-29: 70 ug/min via INTRAVENOUS

## 2020-03-29 SURGICAL SUPPLY — 27 items
BAG COUNTER SPONGE EZ (MISCELLANEOUS) IMPLANT
BALLN POSTPARTUM SOS BAKRI (BALLOONS) ×3
BALLOON POSTPARTUM SOS BAKRI (BALLOONS) ×1 IMPLANT
CANISTER SUC SOCK COL 7IN (MISCELLANEOUS) ×3 IMPLANT
CATH ROBINSON RED A/P 16FR (CATHETERS) ×3 IMPLANT
COUNTER SPONGE BAG EZ (MISCELLANEOUS)
COVER WAND RF STERILE (DRAPES) ×3 IMPLANT
FILTER UTR ASPR SPEC (MISCELLANEOUS) ×1 IMPLANT
FLTR UTR ASPR SPEC (MISCELLANEOUS) ×3
GAUZE SPONGE 4X4 16PLY XRAY LF (GAUZE/BANDAGES/DRESSINGS) ×6 IMPLANT
GLOVE SURG ENC MOIS LTX SZ8 (GLOVE) ×6 IMPLANT
GOWN STRL REUS W/ TWL LRG LVL3 (GOWN DISPOSABLE) ×1 IMPLANT
GOWN STRL REUS W/ TWL XL LVL3 (GOWN DISPOSABLE) ×1 IMPLANT
GOWN STRL REUS W/TWL LRG LVL3 (GOWN DISPOSABLE) ×3
GOWN STRL REUS W/TWL XL LVL3 (GOWN DISPOSABLE) ×3
KIT BERKELEY 1ST TRIMESTER 3/8 (MISCELLANEOUS) ×6 IMPLANT
KIT TURNOVER CYSTO (KITS) ×3 IMPLANT
MANIFOLD NEPTUNE II (INSTRUMENTS) IMPLANT
NS IRRIG 500ML POUR BTL (IV SOLUTION) ×3 IMPLANT
PACK DNC HYST (MISCELLANEOUS) ×3 IMPLANT
PAD OB MATERNITY 4.3X12.25 (PERSONAL CARE ITEMS) ×3 IMPLANT
PAD PREP 24X41 OB/GYN DISP (PERSONAL CARE ITEMS) ×3 IMPLANT
SET BERKELEY SUCTION TUBING (SUCTIONS) ×3 IMPLANT
TOWEL OR 17X26 4PK STRL BLUE (TOWEL DISPOSABLE) IMPLANT
VACURETTE 10 RIGID CVD (CANNULA) ×3 IMPLANT
VACURETTE 12 RIGID CVD (CANNULA) IMPLANT
VACURETTE 8 RIGID CVD (CANNULA) IMPLANT

## 2020-03-29 NOTE — ED Notes (Signed)
OB at bedside

## 2020-03-29 NOTE — Discharge Instructions (Signed)
Postpartum hemorrhage. Elsevier.">  Postpartum Hemorrhage Postpartum hemorrhage is losing too much blood after childbirth. Bleeding (lochia) after vaginal or cesarean delivery is normal and should be expected. Menstrual or period-like bleeding will occur for several days after childbirth. However, postpartum hemorrhage is very heavy bleeding over a shorter period of time. This is a serious condition. Postpartum hemorrhage usually occurs within the first 24 hours after delivery. However, delayed postpartum hemorrhage can happen after you leave the hospital. This is rare. What are the causes? This condition is caused by:  A loss of muscle tone in the uterus after childbirth.  Failure to deliver all of the placenta.  Trauma to the uterus, cervix, or vagina during delivery.  A disorder that prevents blood from clotting. This is rare. What increases the risk? This condition is likely to develop in women who:  Have a history of postpartum hemorrhage.  Have a long labor or need medicine to make contractions stronger.  Have a large baby (fetal macrosomia).  Have a hypertensive disorder of pregnancy. These include high blood pressure, preeclampsia, or eclampsia.  Have complications during labor, such as an infection or heavy bleeding.  Are obese.  Are pregnant with more than one baby (twins or more). What are the signs or symptoms? Symptoms of this condition include:  Passing large clots or pieces of tissue. These may be small pieces of placenta left after delivery.  Soaking more than one sanitary pad per hour.  Severe pain near the vagina or perineum (retroperitoneal hematoma).  A drop in blood pressure.  Light-headedness or fainting. How is this diagnosed? This condition may be diagnosed based on:  Your symptoms.  A physical exam of your perineum, vagina, cervix, and uterus.  Weighing of blood-soaked perineal pads or items to determine the amount of blood loss.  Tests,  including: ? Blood pressure and pulse measurements. ? Blood tests. ? An ultrasound. How is this treated? Treatment for this condition depends on the severity of your symptoms. It may include:  Massage of the uterus and manual removal of blood clots.  Medicines to help the uterus return to normal size.  Inserting a balloon that puts pressure on the walls of the uterus (uterine tamponade).  Blood transfusions and fluids given through an IV that is inserted into one of your veins.  A medical procedure to compress arteries that supply blood to the uterus. Sometimes bleeding occurs if portions of the placenta are left behind in the uterus after delivery. If this happens, a curettage or scraping of the inside of the uterus must be done. This usually stops the bleeding. If curettage does not stop the bleeding, surgery may be done to remove the uterus (hysterectomy). This surgery is rare. Other treatments may be needed if bleeding is caused by clotting or bleeding problems that are not related to the pregnancy. Follow these instructions at home: Activity  Rest as told by your health care provider.  Limit your activity as directed by your health care provider. Lifestyle  Do not use tampons.  Do not douche or have sexual intercourse until your health care provider approves. General instructions  Take over-the-counter and prescription medicines only as told by your health care provider.  Drink enough fluid to keep your urine pale yellow.  Eat foods that are rich in iron, such as spinach, red meat, and legumes.  Keep track of the number of pads you use each day and how soaked they are. Write down this information.  Keep all follow-up visits  as told by your health care provider. This is important.      Contact a health care provider if:  You have a fever.  You are using an increasing number of sanitary pads per hour.  Your bleeding changes from brownish or pink to bright  red.  Your heart rate is faster than usual. Get help right away if you:  Experience severe cramps in your stomach, back, or abdomen.  Pass large clots or tissue. Save any tissue for your health care provider to look at.  Have heavy bleeding that soaks one pad in an hour (or less) for 2 hours.  Faint or become dizzy, weak, or light-headed.  Are urinating less than usual or not at all.  Have shortness of breath or difficulty breathing.  Have sudden chest pain. These symptoms may represent a serious problem that is an emergency. Do not wait to see if the symptoms will go away. Get medical help right away. Call your local emergency services (911 in the [place] Do not drive yourself to the hospital. Summary  Postpartum hemorrhage is losing too much blood after childbirth.  Postpartum hemorrhage usually occurs within the first 24 hours after delivery. Delayed postpartum hemorrhage can happen after you leave the hospital. This is rare.  Follow your health care provider's instructions about activity and diet. Drink plenty of fluid and eat foods that are rich in iron.  Tell your health care provider right away if you have heavy bleeding that soaks one pad in an hour (or less) for 2 hours.  Get help right away if you have severe cramps, shortness of breath, or sudden chest pain. This information is not intended to replace advice given to you by your health care provider. Make sure you discuss any questions you have with your health care provider. Document Revised: 09/30/2018 Document Reviewed: 09/30/2018 Elsevier Patient Education  2021 [employer].

## 2020-03-29 NOTE — ED Notes (Signed)
Patient assisted to use bedpan. Vaginal bleeding has decreased.

## 2020-03-29 NOTE — Transfer of Care (Signed)
Immediate Anesthesia Transfer of Care Note  Patient: Tracey Christensen  Procedure(s) Performed: DILATATION AND EVACUATION (N/A Vagina )  Patient Location: PACU  Anesthesia Type:General  Level of Consciousness: sedated  Airway & Oxygen Therapy: Patient Spontanous Breathing and Patient connected to face mask oxygen  Post-op Assessment: Report given to RN and Post -op Vital signs reviewed and stable  Post vital signs: Reviewed and stable  Last Vitals:  Vitals Value Taken Time  BP 101/68 03/29/20 0648  Temp 36.2 C 03/29/20 0648  Pulse 107 03/29/20 0656  Resp 24 03/29/20 0656  SpO2 100 % 03/29/20 0656  Vitals shown include unvalidated device data.  Last Pain:  Vitals:   03/29/20 0445  TempSrc:   PainSc: 0-No pain         Complications: No complications documented.

## 2020-03-29 NOTE — ED Notes (Signed)
Pt sitting in lobby, tearful; reports vag bleeding; front of pants saturated; pt taken to triage for reassessment; st vag delivery 3/10 with no complications, tubal ligation; has been doing fine until tonight had sudden onset gush of fluid, bleeding & clots; denies any precipitating factors; while speaking with pt regarding symptoms pt has sudden onset gush of bright red bleeding, steadily streaming onto floor; charge nurse notified; pt taken immed to room 7; assisted to undress and into hosp gown; care nurse Marcellina Millin, RN, charge nurse Vania Rea RN at bedside to assist; Dr Elesa Massed notified of pt's arrival and status change; acuity level changed

## 2020-03-29 NOTE — Op Note (Signed)
  Operative Note  03/29/2020 6:39 AM  PRE-OP DIAGNOSIS: Retained products of conception/ placenta with postpartum hemorrhage  POST-OP DIAGNOSIS: same  SURGEON: Annamarie Major, MD, FACOG  ANESTHESIA: General   PROCEDURE: Procedure(s): DILATATION AND CURETTAGE PLACEMENT of BAKRI BALLOON   ESTIMATED BLOOD LOSS: 2000 mL   SPECIMENS: POC - fragments of placenta like tissue  COMPLICATIONS: none  DISPOSITION: PACU - hemodynamically stable.  CONDITION: stable  FINDINGS: Exam under anesthesia revealed a enlarged size uterus with heavy bleeding.  She continued to have brisk bleeding despite curettage of large amount of tissue.  Bakri placed to help apply compression and alleviate bleeding.  PROCEDURE IN DETAIL: After informed consent was obtained, the patient was taken to the operating room where anesthesia was obtained without difficulty. The patient was positioned in the dorsal lithotomy position with [employer]. Time out was performed and an exam under anesthesia was performed. The vagina, perineum, and lower abdomen were prepped and draped in a normal sterile fashion. The bladder was emptied with an I&O catheter. A speculum was placed into the vagina and the cervix was grasped with a single toothed tenaculum. The uterus was sounded to 20+cm.  Curettage was performed with a banjo curette as was as grasping forceps. The suction curette was then tested and found to be adequate, and the 55mm rigid suction cannula was advanced into the uterine cavity. The suction was activated and the contents of the uterus were aspirated until no further tissue was obtained. The uterus was then curetted to gritty texture throughout.   As she continued with brisk bleeding despite multiple passes and curettings, the Bakri balloon was placed carefully and inflated with 200 mL of saline in the balloon.  Bleeding stopped at this point.  At the end of the procedure bleeding was noted to be Minimal.  A suture (2-0  vicryl) was placed at the tenaculum site on the cervix as it was bleeding.   All instruments were then removed from the vagina.The patient tolerated the procedure well. All sponge, instrument, and needle counts were correct. The patient was taken to the recovery room in good condition.   Annamarie Major, MD, [facility], [facility] 03/29/2020  6:39 AM

## 2020-03-29 NOTE — Anesthesia Procedure Notes (Signed)
Procedure Name: Intubation Date/Time: 03/29/2020 5:26 AM Performed by: Waldo Laine, CRNA Pre-anesthesia Checklist: Patient identified, Patient being monitored, Timeout performed, Emergency Drugs available and Suction available Patient Re-evaluated:Patient Re-evaluated prior to induction Oxygen Delivery Method: Circle system utilized Preoxygenation: Pre-oxygenation with 100% oxygen Induction Type: IV induction and Rapid sequence Laryngoscope Size: 3 and McGraph Grade View: Grade I Tube type: Oral Tube size: 6.5 mm Number of attempts: 1 Airway Equipment and Method: Stylet Placement Confirmation: ETT inserted through vocal cords under direct vision,  positive ETCO2 and breath sounds checked- equal and bilateral Secured at: 21 cm Tube secured with: Tape Dental Injury: Teeth and Oropharynx as per pre-operative assessment

## 2020-03-29 NOTE — Progress Notes (Signed)
I had a long face-to-face discussion with the patient and her significant other.  We discussed the events in the ER, the findings in the ER, and the course of the surgery.  We discussed the blood loss and blood transfusions.   We discussed the rationale behind the blood transfusions and the possible need for another blood transfusion and why.  We discussed bleeding management and the general management of the Bakri balloon.  We discussed the plan moving forward with lowering the volume (pressure) in the balloon and assessing bleeding after this.   We discussed that the plan is nebulous at this point with general milestones we'd like her to reach and what we will do at each step.  I will keep them informed of the findings at each step and what the plan will be at that step.   Her partner discussed his concerns to me that the plan did not seem specific enough. I acknowledged that this is a legitimate concern.  I discussed that the plan at this moment was to check a CBC at about 730 and see the result. We would use this result to guide whether we transfuse more blood.  We might still elect to transfuse another unit of blood after we discuss the pros and cons of doing this. The plan for the bleeding is that the current state is stable with only a little bleeding so far.  The plan will be to await the results of the CBC and then likely will take about 50 ml (200 mL total placed in surgery) and then watch her bleeding for several hours. The overall plan will be to have the balloon that is in her uterus out by the middle of the night.   We discussed that while this is the ideal path forward, we might have to adjust our plan if something changes.   They voiced that they had no more questions at the moment.   I voiced an apology due to her circumstances in the ER and for any lack of or poor communication on our team's part. All questions answered at this time. An order was placed for a 730PM CBC.   Thomasene Mohair, MD, [facility], [facility] 03/29/2020 5:38 PM  I

## 2020-03-29 NOTE — ED Notes (Signed)
Patient brought back from triage with heavy vaginal bleeding noted in lobby. Post vaginal delivery on 3/10. Pt reports heavy vaginal bleeding that woke her up from sleep at approx 2300. Patient saturated through pants and wheelchair. MD aware. Type and screen ordered. Provider at bedside.

## 2020-03-29 NOTE — ED Provider Notes (Signed)
[facility] Emergency Department Provider Note  ____________________________________________   Event Date/Time   First MD Initiated Contact with Patient 03/29/20 0140     (approximate)  I have reviewed the triage vital signs and the nursing notes.   HISTORY  Chief Complaint Postpartum Complications    HPI Tracey Christensen is a 30 y.o. female with history of gestational hypertension with recent normal spontaneous vaginal delivery by Dr. Bjorn Pippin on 03/23/2020 who presents to the emergency department for concerns for increased vaginal bleeding.  States that she had no complications with delivery and no postpartum hemorrhage.  States tonight while in bed tonight at 11 PM she felt like her water broke and she began having bright red bleeding and passing clots.  She reports having some abdominal cramping after her BTL on 03/24/2020 but no other abdominal pain.  No foul-smelling discharge.  No dysuria.  No fever.  She denies any chest pain, shortness of breath or dizziness.  She is not on any blood thinners.        Past Medical History:  Diagnosis Date  . Gestational hypertension     Patient Active Problem List   Diagnosis Date Noted  . Unwanted fertility   . [redacted] weeks gestation of pregnancy 03/23/2020  . Labor and delivery indication for care or intervention 02/04/2020  . Obesity affecting pregnancy 09/27/2019  . Morbid obesity (HCC) 232 lbs 08/19/2019  . Supervision of high risk pregnancy in first trimester 08/10/2019  . History of pre-eclampsia in prior pregnancy, currently pregnant 05/28/2017    Past Surgical History:  Procedure Laterality Date  . HAND SURGERY Right   . TUBAL LIGATION Bilateral 03/24/2020   Procedure: POST PARTUM TUBAL LIGATION;  Surgeon: Vena Austria, MD;  Location: [facility] ORS;  Service: Gynecology;  Laterality: Bilateral;    Prior to Admission medications   Medication Sig Start Date End Date Taking? Authorizing Provider   ibuprofen (ADVIL) 600 MG tablet Take 1 tablet (600 mg total) by mouth every 6 (six) hours. 03/24/20   Vena Austria, MD  oxyCODONE-acetaminophen (PERCOCET) 5-325 MG tablet Take 1 tablet by mouth every 4 (four) hours as needed for severe pain. 03/24/20 03/24/21  Vena Austria, MD  Prenatal Vit-Fe Fumarate-FA (MULTIVITAMIN-PRENATAL) 27-0.8 MG TABS tablet Take 1 tablet by mouth daily at 12 noon.    [provider]  famotidine (PEPCID) 20 MG tablet Take 1 tablet (20 mg total) by mouth 2 (two) times daily. 09/06/15 11/11/18  Sharman Cheek, MD    Allergies Patient has no known allergies.  Family History  Problem Relation Age of Onset  . Diabetes Maternal Grandmother   . Hypertension Paternal Grandmother   . Diabetes Paternal Grandfather     Social History Social History   Tobacco Use  . Smoking status: Never Smoker  . Smokeless tobacco: Never Used  Vaping Use  . Vaping Use: Never used  Substance Use Topics  . Alcohol use: Not Currently  . Drug use: No    Review of Systems Constitutional: No fever. Eyes: No visual changes. ENT: No sore throat. Cardiovascular: Denies chest pain. Respiratory: Denies shortness of breath. Gastrointestinal: No nausea, vomiting, diarrhea. Genitourinary: Negative for dysuria. Musculoskeletal: Negative for back pain. Skin: Negative for rash. Neurological: Negative for focal weakness or numbness.  ____________________________________________   PHYSICAL EXAM:  VITAL SIGNS: ED Triage Vitals  Enc Vitals Group     BP 03/29/20 0016 (!) 154/101     Pulse Rate 03/29/20 0016 63     Resp 03/29/20  0016 18     Temp 03/29/20 0016 98.6 F (37 C)     Temp Source 03/29/20 0016 Oral     SpO2 03/29/20 0016 99 %     Weight 03/29/20 0017 237 lb (107.5 kg)     Height 03/29/20 0017 5\' 5"  (1.651 m)     Head Circumference --      Peak Flow --      Pain Score 03/29/20 0017 0     Pain Loc --      Pain Edu? --      Excl. in GC? --     CONSTITUTIONAL: Alert and oriented and responds appropriately to questions.  Appears anxious HEAD: Normocephalic EYES: Conjunctivae clear, pupils appear equal, EOM appear intact ENT: normal nose; moist mucous membranes NECK: Supple, normal ROM CARD: RRR; S1 and S2 appreciated; no murmurs, no clicks, no rubs, no gallops RESP: Normal chest excursion without splinting or tachypnea; breath sounds clear and equal bilaterally; no wheezes, no rhonchi, no rales, no hypoxia or respiratory distress, speaking full sentences ABD/GI: Normal bowel sounds; slightly distended, soft, nontender to palpation, uterus feels boggy to palpation GU:  Normal external genitalia without signs of bleeding.  Large amount of bright red blood and clots coming from her cervical os.  Small internal vaginal laceration noted just at the introitus that is not actively bleeding.  Chaperone present for exam. BACK: The back appears normal EXT: Normal ROM in all joints; no deformity noted, no edema; no cyanosis SKIN: Normal color for age and race; warm; no rash on exposed skin NEURO: Moves all extremities equally PSYCH: The patient's mood and manner are appropriate.  ____________________________________________   LABS (all labs ordered are listed, but only abnormal results are displayed)  Labs Reviewed  CBC WITH DIFFERENTIAL/PLATELET - Abnormal; Notable for the following components:      Result Value   RBC 3.69 (*)    Hemoglobin 9.4 (*)    HCT 29.2 (*)    MCV 79.1 (*)    MCH 25.5 (*)    nRBC 0.3 (*)    All other components within normal limits  BASIC METABOLIC PANEL - Abnormal; Notable for the following components:   Calcium 8.4 (*)    All other components within normal limits  HCG, QUANTITATIVE, PREGNANCY - Abnormal; Notable for the following components:   hCG, Beta Chain, Quant, S 155 (*)    All other components within normal limits  HEMOGLOBIN AND HEMATOCRIT, BLOOD  TYPE AND SCREEN  TYPE AND SCREEN    ____________________________________________  EKG  none ____________________________________________  RADIOLOGY I, Hawraa Stambaugh, personally viewed and evaluated these images (plain radiographs) as part of my medical decision making, as well as reviewing the written report by the radiologist.  ED MD interpretation: Retained products of conception.  Official radiology report(s): US PELVIS (TRANSABDOMINAL ONLY)  Result Date: 03/29/2020 CLINICAL DATA:  Heavy vaginal bleeding. Concern for retained products of conception. Vaginal delivery 03/23/2020. Beta HCG 155. Tubal ligation 03/24/2020. EXAM: TRANSABDOMINAL ULTRASOUND OF PELVIS TECHNIQUE: Transabdominal ultrasound examination of the pelvis was performed including evaluation of the uterus, ovaries, adnexal regions, and pelvic cul-de-sac. COMPARISON:  Ultrasound Ob 12/06/2019. FINDINGS: Uterus Measurements: 17.2 x 10.8 x 10.5 cm = volume: 1,018 mL. Enlarged postpartum uterus. No fibroids or other mass visualized. Endometrium Thickness: 21 mm. Thickened with heterogeneous appearance and associated vascularity. Right ovary Measurements: 4 x 2.7 x 2.4 cm = volume: 13.1 mL. Normal appearance/no adnexal mass. Left ovary Measurements: 3.5 x 1.5 x 2.6 cm =  volume: 6.8 mL. Normal appearance/no adnexal mass. Other findings:  No abnormal free fluid. IMPRESSION: 1. Thickened, heterogeneous, and vascular endometrium suggestive of retained products of conception. 2. Bilateral ovaries are unremarkable. These results will be called to the ordering clinician or representative by the Radiologist Assistant, and communication documented in the PACS or [employer]. Electronically Signed   By: Tish Frederickson M.D.   On: 03/29/2020 03:21    ____________________________________________   PROCEDURES  Procedure(s) performed (including Critical Care):  Procedures  CRITICAL CARE Performed by: Baxter Hire Elisse Pennick   Total critical care time: 45 minutes  Critical  care time was exclusive of separately billable procedures and treating other patients.  Critical care was necessary to treat or prevent imminent or life-threatening deterioration.  Critical care was time spent personally by me on the following activities: development of treatment plan with patient and/or surrogate as well as nursing, discussions with consultants, evaluation of patient's response to treatment, examination of patient, obtaining history from patient or surrogate, ordering and performing treatments and interventions, ordering and review of laboratory studies, ordering and review of radiographic studies, pulse oximetry and re-evaluation of patient's condition.  ____________________________________________   INITIAL IMPRESSION / ASSESSMENT AND PLAN / ED COURSE  As part of my medical decision making, I reviewed the following data within the electronic MEDICAL RECORD NUMBER Nursing notes reviewed and incorporated, Labs reviewed , Old chart reviewed, A consult was requested and obtained from this/these consultant(s) OBGYN and Notes from prior ED visits         Patient here with concerns for postpartum hemorrhage.  She is hemodynamically stable.  Her hemoglobin is 9.4 down from 10.7 on March 11.  Abdominal exam benign.  Will discuss with OB/GYN on-call.  Transvaginal ultrasound has been ordered but not yet completed.  Patient is afebrile.  ED PROGRESS  1:53 AM  Spoke with Dr. Tiburcio Pea with OB/GYN.  He recommends getting emergent transvaginal ultrasound for further evaluation and giving patient 0.2 mg of IM Methergine now.  1:55 AM  Spoke with [place] tech.  They are finishing the ultrasound of another patient and then will come to the room to do her ultrasound next.  2:40 AM  [place] at bedside.  3:25 AM  Pt continues to be hemodynamically stable and she states she feels her bleeding is slowly improving but still feels gushes of blood between her legs.  Ultrasound shows thickened heterogeneous and  vascular endometrium suggestive of retained products of conception.  Will discuss with Dr. Tiburcio Pea.  3:50 AM  D/w Dr. Tiburcio Pea with OBGYN who will see patient in the ED.  4:00 AM  Pt's nurse reports patient just passed a very large clot and her bleeding seems to have slowed significantly.  Will update Dr. Tiburcio Pea.  4:01 AM  Dr. Tiburcio Pea at bedside.  4:06 AM  Dr. Tiburcio Pea will take patient to OR for D&C.  I reviewed all nursing notes and pertinent previous records as available.  I have reviewed and interpreted any EKGs, lab and urine results, imaging (as available).  ____________________________________________   FINAL CLINICAL IMPRESSION(S) / ED DIAGNOSES  Final diagnoses:  Delayed postpartum hemorrhage  Retained products of conception with hemorrhage     ED Discharge Orders    None      *Please note:  Tracey Christensen was evaluated in Emergency Department on 03/29/2020 for the symptoms described in the history of present illness. She was evaluated in the context of the global COVID-19 pandemic, which necessitated consideration that the patient might  be at risk for infection with the SARS-CoV-2 virus that causes COVID-19. Institutional protocols and algorithms that pertain to the evaluation of patients at risk for COVID-19 are in a state of rapid change based on information released by regulatory bodies including the CDC and federal and state organizations. These policies and algorithms were followed during the patient's care in the ED.  Some ED evaluations and interventions may be delayed as a result of limited staffing during and the pandemic.*   Note:  This document was prepared using Dragon voice recognition software and may include unintentional dictation errors.   Zakariya Knickerbocker, Layla Maw, DO 03/29/20 [phone]

## 2020-03-29 NOTE — H&P (Signed)
Obstetrics & Gynecology History and Physical Note  Date of Consultation: 03/29/2020   Requesting Provider: [facility] ER  Primary OBGYN: [facility] Primary Care Provider: Patient, No Pcp Per  Reason for Consultation: Bleeding  History of Present Illness: Ms. Seith is a 30 y.o. [id number] (No LMP recorded.), with the above CC. She is postpartum 6 days from vaginal delivery w no reported complications and then a PP BTL, noted episode of all of a sudden bleeding a feeling like her water broke and has been bleeding heavy ever since (today).  Mild pain.  No fever.  ROS: A review of systems was performed and was complete and comprehensive, except as stated in the above HPI.  OBGYN History: As per HPI. OB History    Gravida  4   Para  4   Term  3   Preterm  1   AB  0   Living  4     SAB  0   IAB  0   Ectopic  0   Multiple  0   Live Births  4            Past Medical History: Past Medical History:  Diagnosis Date  . Gestational hypertension     Past Surgical History: Past Surgical History:  Procedure Laterality Date  . HAND SURGERY Right   . TUBAL LIGATION Bilateral 03/24/2020   Procedure: POST PARTUM TUBAL LIGATION;  Surgeon: Vena Austria, MD;  Location: [facility] ORS;  Service: Gynecology;  Laterality: Bilateral;    Family History:  Family History  Problem Relation Age of Onset  . Diabetes Maternal Grandmother   . Hypertension Paternal Grandmother   . Diabetes Paternal Grandfather    She denies any female cancers, bleeding or blood clotting disorders.   Social History:  Social History   Socioeconomic History  . Marital status: Single    Spouse name: Not on file  . Number of children: Not on file  . Years of education: Not on file  . Highest education level: Not on file  Occupational History  . Not on file  Tobacco Use  . Smoking status: Never Smoker  . Smokeless tobacco: Never Used  Vaping Use  . Vaping Use: Never used  Substance and Sexual Activity  .  Alcohol use: Not Currently  . Drug use: No  . Sexual activity: Yes    Partners: Male    Birth control/protection: None  Other Topics Concern  . Not on file  Social History Narrative  . Not on file   Social Determinants of Health   Financial Resource Strain: Not on file  Food Insecurity: Not on file  Transportation Needs: Not on file  Physical Activity: Not on file  Stress: Not on file  Social Connections: Not on file  Intimate Partner Violence: Not At Risk  . Fear of Current or Ex-Partner: No  . Emotionally Abused: No  . Physically Abused: No  . Sexually Abused: No    Allergy: No Known Allergies  Current Outpatient Medications: (Not in a hospital admission)   Hospital Medications: No current facility-administered medications for this encounter.   Current Outpatient Medications  Medication Sig Dispense Refill  . ibuprofen (ADVIL) 600 MG tablet Take 1 tablet (600 mg total) by mouth every 6 (six) hours. 30 tablet 0  . oxyCODONE-acetaminophen (PERCOCET) 5-325 MG tablet Take 1 tablet by mouth every 4 (four) hours as needed for severe pain. 20 tablet 0  . Prenatal Vit-Fe Fumarate-FA (MULTIVITAMIN-PRENATAL) 27-0.8 MG TABS tablet Take 1  tablet by mouth daily at 12 noon.      Physical Exam: Vitals:   03/29/20 0017 03/29/20 0147 03/29/20 0214 03/29/20 0400  BP:  (!) 142/92 (!) 147/95 (!) 140/101  Pulse:  77 72 (!) 57  Resp:  20 18 18   Temp:   99.3 F (37.4 C)   TempSrc:   Oral   SpO2:  100% 99% 100%  Weight: 107.5 kg     Height: 5\' 5"  (1.651 m)       Temp:  [98.6 F (37 C)-99.3 F (37.4 C)] 99.3 F (37.4 C) (03/16 0214) Pulse Rate:  [57-77] 57 (03/16 0400) Resp:  [18-20] 18 (03/16 0400) BP: (140-154)/(92-101) 140/101 (03/16 0400) SpO2:  [99 %-100 %] 100 % (03/16 0400) Weight:  [107.5 kg] 107.5 kg (03/16 0017) No intake/output data recorded. Total I/O In: 1000 [IV Piggyback:1000] Out: -   Intake/Output Summary (Last 24 hours) at 03/29/2020 0412 Last data  filed at 03/29/2020 0334 Gross per 24 hour  Intake 1000 ml  Output --  Net 1000 ml    Body mass index is 39.44 kg/m. Constitutional: Well nourished, well developed female in no acute distress.  HEENT: normal Neck:  Supple, normal appearance, and no thyromegaly  Cardiovascular:Regular rate and rhythm.  No murmurs, rubs or gallops. Respiratory:  Clear to auscultation bilateral. Normal respiratory effort Abdomen: positive bowel sounds and no masses, hernias; diffusely non tender to palpation, non distended Neuro: grossly intact Psych:  Normal mood and affect.  Skin:  Warm and dry.  MS: normal gait and normal bilateral lower extremity strength/ROM/symmetry Lymphatic:  No inguinal lymphadenopathy.   Recent Labs  Lab 03/24/20 0525 03/24/20 1832 03/29/20 0022  WBC 11.2* 13.2* 6.0  HGB 9.8* 10.7* 9.4*  HCT 29.7* 32.0* 29.2*  PLT 207 205 179   Recent Labs  Lab 03/23/20 0854 03/29/20 0022  NA 136 138  K 3.7 4.1  CL 108 109  CO2 20* 23  BUN <5* 16  CREATININE 0.45 0.94  CALCIUM 8.5* 8.4*  PROT 6.6  --   BILITOT 0.5  --   ALKPHOS 112  --   ALT 9  --   AST 13*  --   GLUCOSE 85 95   Recent Labs  Lab 03/29/20 0205  ABORH A POS   Imaging:  Ultrasound independently reviewed/interpreted by self. Thickened lining c/w retained POC  Assessment: Ms. Whisner is a 30 y.o. [phone] (No LMP recorded.) who presented to the ED with complaints of bleeding; findings are consistent with postpartum hemorrhage and likely retained tissue and clots.  She has had some up and down bleeding since 26 and Methergine, but counseled the only way to know for sure is to have D&C for exam and definitive treatment.  As she does not wish to go home and this happen again, we will proceed w D&C.[id number]  Plan: Pros and cons of surgery discussed Plan d/c to home soon thereafter (infant at home) Methergine and pain meds  [place], MD, [facility], [facility] 03/29/2020  4:12 AM Pager  [phone]

## 2020-03-29 NOTE — ED Notes (Signed)
Patient linen and gown changed. Patient provided with brief and pads. Blankets provided. Denies other needs.

## 2020-03-29 NOTE — Progress Notes (Addendum)
Progress Note  Chief Complaint: Delayed postpartum  hemorrhage Subjective:  30 y.o. [id number] postpartum day #6 s/p SVD admitted overnight for delayed postpartum hemorrhage. Patient is currently tearful, concerned regarding amount of blood loss and her need for a blood transfusion. Patient with several questions regarding her current clinical situation and the care she has received thus far. All questions answered. Patient denies S&S of dizziness or lightheadedness.  Medications SCHEDULED MEDICATIONS  . diphenhydrAMINE      . docusate sodium  100 mg Oral BID  . ketorolac  30 mg Intravenous Q6H    MEDICATION INFUSIONS  . lactated ringers 125 mL/hr at 03/29/20 0915    PRN MEDICATIONS  acetaminophen, bisacodyl, morphine injection, ondansetron **OR** ondansetron (ZOFRAN) IV, oxyCODONE-acetaminophen, senna-docusate, simethicone    Objective:   Vitals:   03/29/20 1100 03/29/20 1105 03/29/20 1110 03/29/20 1115  BP:    113/71  Pulse:    93  Resp:      Temp:      TempSrc:      SpO2: 99% 99% 99% 100%  Weight:      Height:        Current Vital Signs 24h Vital Sign Ranges  T 98.8 F (37.1 C) Temp  Avg: 98.4 F (36.9 C)  Min: 97.1 F (36.2 C)  Max: 99.3 F (37.4 C)  BP 113/71 BP  Min: 76/40  Max: 176/106  HR 93 Pulse  Avg: 85.3  Min: 39  Max: 109  RR 16 Resp  Avg: 20  Min: 11  Max: 34  SaO2 100 % Room Air SpO2  Avg: 98.9 %  Min: 97 %  Max: 100 %       24 Hour I/O Current Shift I/O  Time Ins Outs 03/15 0701 - 03/16 0700 In: 3100 [I.V.:1500] Out: 2000  03/16 0701 - 03/16 1900 In: 610  Out: 200 [Drains:160]  General: NAD Pulmonary: no increased work of breathing Abdomen: non-distended, non-tender, fundus firm at level of umbilicus Extremities: no edema, no erythema, no tenderness Bakri balloon intact - bleeding noted on peri-pad. Will confirm with nursing total EBL on pads changed.  Labs:  Recent Labs  Lab 03/24/20 1832 03/29/20 0022 03/29/20 0356 03/29/20 0711  WBC  13.2* 6.0  --  17.2*  HGB 10.7* 9.4* 8.9* 7.5*  HCT 32.0* 29.2* 27.6* 22.7*  PLT 205 179  --  164     Assessment:   29 y.o. [id number] postpartum day # 6 status post SVD, now delayed postpartum hemorrhage secondary to retained products of conception. POD 0 from D/C with Bakri balloon in place.   Plan:   1) Acute blood loss anemia - s/p 1 unit PRBC transfusion - will recheck CBC 4 hours post-transfusion  2) Bakri balloon in place - 200 ml volume instilled. Current bleeding noted on peri-pad, will confirm EBL with nursing. Continue to monitor, plan to decrease Bakri balloon volume when clinically indicated. Reviewed findings with MD Jean Rosenthal.  3) Disposition- continue current care  Zipporah Plants, CNM 03/29/2020 12:10 PM

## 2020-03-29 NOTE — ED Triage Notes (Signed)
Pt states she had a normal vaginal delivery on 3/10 and her tubes tied 3/11, pt states tonight she was laying in bed and felt like her water broke again. pt states it consisted of watery like fluid and blood and clots. Pt denies any pain.

## 2020-03-29 NOTE — ED Notes (Signed)
Ultrasound at bedside

## 2020-03-29 NOTE — ED Notes (Signed)
Patient unable to use bedpan. Patient assisted to use toilet in room. Patient denies dizziness upon ambulation. Steady gait noted. Bleeding significantly decreased since large clot passed approx 20 minutes ago. Provider aware.

## 2020-03-29 NOTE — Progress Notes (Signed)
Discussed repeat hemoglobin results. I initially told them the number was 7.4, per the RN report. However, after leaving the room I checked and the number had actually dropped to 6.6.    Prior to leaving the room, I did removed 50 mL saline from the Bakri Balloon.  The report of her bleeding is that her bleeding is minimal.  With this in mind, I recommended removing 50 mL from the balloon, to which they agreed.    Based on the lower hemoglobin, which appears to be a result of equilibration, I recommended another unit of pRBCs.  They agreed to proceed with another unit of pRBCs.    Will re-assess bleeding and remove another 50 mL around 12-1am.     Tracey Mohair, MD, [facility], [facility] 03/29/2020 9:12 PM

## 2020-03-29 NOTE — Anesthesia Preprocedure Evaluation (Signed)
Anesthesia Evaluation  Patient identified by MRN, date of birth, ID band Patient awake  General Assessment Comment:Retained products of conception, delivered 6 days ago.  Reviewed: Allergy & Precautions, NPO status , Patient's Chart, lab work & pertinent test results  History of Anesthesia Complications Negative for: history of anesthetic complications  Airway Mallampati: III  TM Distance: >3 FB Neck ROM: Full    Dental no notable dental hx. (+) Teeth Intact   Pulmonary neg pulmonary ROS, neg sleep apnea, neg COPD, Patient abstained from smoking.Not current smoker,    Pulmonary exam normal breath sounds clear to auscultation       Cardiovascular Exercise Tolerance: Good METShypertension, (-) CAD and (-) Past MI (-) dysrhythmias  Rhythm:Regular Rate:Normal - Systolic murmurs    Neuro/Psych negative neurological ROS  negative psych ROS   GI/Hepatic neg GERD  ,(+)     (-) substance abuse  ,   Endo/Other  neg diabetes  Renal/GU negative Renal ROS     Musculoskeletal   Abdominal   Peds  Hematology   Anesthesia Other Findings Past Medical History: No date: Gestational hypertension  Reproductive/Obstetrics                             Anesthesia Physical Anesthesia Plan  ASA: II and emergent  Anesthesia Plan: General   Post-op Pain Management:    Induction: Intravenous  PONV Risk Score and Plan: 4 or greater and Ondansetron, Dexamethasone and Midazolam  Airway Management Planned: Oral ETT  Additional Equipment: None  Intra-op Plan:   Post-operative Plan: Extubation in OR  Informed Consent: I have reviewed the patients History and Physical, chart, labs and discussed the procedure including the risks, benefits and alternatives for the proposed anesthesia with the patient or authorized representative who has indicated his/her understanding and acceptance.     Dental advisory  given  Plan Discussed with: CRNA and Surgeon  Anesthesia Plan Comments: (Discussed risks of anesthesia with patient, including PONV, sore throat, lip/dental damage. Rare risks discussed as well, such as cardiorespiratory and neurological sequelae. Patient understands.)        Anesthesia Quick Evaluation

## 2020-03-30 ENCOUNTER — Encounter: Payer: Self-pay | Admitting: Obstetrics & Gynecology

## 2020-03-30 DIAGNOSIS — Z8249 Family history of ischemic heart disease and other diseases of the circulatory system: Secondary | ICD-10-CM | POA: Diagnosis not present

## 2020-03-30 DIAGNOSIS — Z833 Family history of diabetes mellitus: Secondary | ICD-10-CM | POA: Diagnosis not present

## 2020-03-30 DIAGNOSIS — D62 Acute posthemorrhagic anemia: Secondary | ICD-10-CM | POA: Diagnosis present

## 2020-03-30 DIAGNOSIS — Z20822 Contact with and (suspected) exposure to covid-19: Secondary | ICD-10-CM | POA: Diagnosis present

## 2020-03-30 LAB — CBC
HCT: 18.4 % — ABNORMAL LOW (ref 36.0–46.0)
HCT: 22.6 % — ABNORMAL LOW (ref 36.0–46.0)
Hemoglobin: 6.2 g/dL — ABNORMAL LOW (ref 12.0–15.0)
Hemoglobin: 7.9 g/dL — ABNORMAL LOW (ref 12.0–15.0)
MCH: 27 pg (ref 26.0–34.0)
MCH: 28.3 pg (ref 26.0–34.0)
MCHC: 33.7 g/dL (ref 30.0–36.0)
MCHC: 35 g/dL (ref 30.0–36.0)
MCV: 80 fL (ref 80.0–100.0)
MCV: 81 fL (ref 80.0–100.0)
Platelets: 136 10*3/uL — ABNORMAL LOW (ref 150–400)
Platelets: 142 10*3/uL — ABNORMAL LOW (ref 150–400)
RBC: 2.3 MIL/uL — ABNORMAL LOW (ref 3.87–5.11)
RBC: 2.79 MIL/uL — ABNORMAL LOW (ref 3.87–5.11)
RDW: 14.7 % (ref 11.5–15.5)
RDW: 15.1 % (ref 11.5–15.5)
WBC: 8.4 10*3/uL (ref 4.0–10.5)
WBC: 9.4 10*3/uL (ref 4.0–10.5)
nRBC: 0 % (ref 0.0–0.2)
nRBC: 0 % (ref 0.0–0.2)

## 2020-03-30 LAB — SURGICAL PATHOLOGY

## 2020-03-30 LAB — PREPARE RBC (CROSSMATCH)

## 2020-03-30 MED ORDER — SODIUM CHLORIDE 0.9% IV SOLUTION: Freq: Once | INTRAVENOUS | Status: AC

## 2020-03-30 MED ORDER — FERROUS SULFATE 325 (65 FE) MG PO TABS: 325.0000 mg | ORAL_TABLET | Freq: Every day | ORAL | 4 refills | Status: DC

## 2020-03-30 NOTE — Progress Notes (Signed)
Father of baby concerned about of amt of fluid remaining in bakari. Will reassess bleeding at 4am and speak to MD

## 2020-03-30 NOTE — Progress Notes (Signed)
Foley removed and pt ambulated to bathroom per MD ok. Pt is asymptomatic at this time-reporting no dizziness, fatigue, etc. Scant bleeding on pad.

## 2020-03-30 NOTE — Progress Notes (Signed)
Removed 62 mL from Bakri. Removed Bakri balloon intact from uterus. Will have catheter removed. Will check CBC in 1 hour and liberalize movements.

## 2020-03-30 NOTE — Anesthesia Postprocedure Evaluation (Signed)
Anesthesia Post Note  Patient: Tracey Christensen  Procedure(s) Performed: DILATATION AND EVACUATION (N/A Vagina )  Patient location during evaluation: PACU Anesthesia Type: General Level of consciousness: awake and alert Pain management: pain level controlled Vital Signs Assessment: post-procedure vital signs reviewed and stable Respiratory status: spontaneous breathing, nonlabored ventilation, respiratory function stable and patient connected to nasal cannula oxygen Cardiovascular status: blood pressure returned to baseline and stable Postop Assessment: no apparent nausea or vomiting Anesthetic complications: no   No complications documented.   Last Vitals:  Vitals:   03/30/20 0400 03/30/20 0606  BP: 118/72 121/85  Pulse: 68 72  Resp: 18   Temp:    SpO2:      Last Pain:  Vitals:   03/30/20 0705  TempSrc:   PainSc: 0-No pain                 Corinda Gubler

## 2020-03-30 NOTE — Progress Notes (Signed)
MD at bedside to discuss plan to discharge pt. Pt expresses understanding of plan. Pt continues to have scant bleeding, has ambulated to bathroom, had one bowel movement, and adequate urine output.

## 2020-03-30 NOTE — OB Triage Note (Signed)
Pt discharged home in stable condition per MD. Discharge instructions given- pt verbalized understanding and reports no questions or concerns at this time. Pt to follow up with MD at office and will call office or hospital with any concerns.

## 2020-03-30 NOTE — Progress Notes (Signed)
CNM at  Bedside to discuss POC with patient and significant other. Pt notified of plan to receive 1 unit of blood and continue monitoring. Upon assessment, bleeding scant and hourly urine output adequate. VSS. Pt reports no pain. Pt tearful at time of explanation. Pt had no questions at this time. Will continue monitoring.

## 2020-03-31 LAB — BPAM RBC
Blood Product Expiration Date: 202203222359
Blood Product Expiration Date: 202204122359
Blood Product Expiration Date: 202204142359
Blood Product Expiration Date: 202204142359
Blood Product Expiration Date: 202204142359
Blood Product Expiration Date: 202204142359
Blood Product Expiration Date: 202204142359
ISSUE DATE / TIME: 202203160613
ISSUE DATE / TIME: 202203160714
ISSUE DATE / TIME: 202203161406
ISSUE DATE / TIME: 202203162227
ISSUE DATE / TIME: 202203170921
Unit Type and Rh: 6200
Unit Type and Rh: 6200
Unit Type and Rh: 6200
Unit Type and Rh: 6200
Unit Type and Rh: 6200
Unit Type and Rh: 6200
Unit Type and Rh: 6200

## 2020-03-31 LAB — TYPE AND SCREEN
ABO/RH(D): A POS
Antibody Screen: NEGATIVE
Unit division: 0
Unit division: 0
Unit division: 0
Unit division: 0
Unit division: 0
Unit division: 0
Unit division: 0

## 2020-03-31 LAB — PREPARE RBC (CROSSMATCH)

## 2020-04-03 ENCOUNTER — Other Ambulatory Visit: Payer: Self-pay

## 2020-04-03 ENCOUNTER — Ambulatory Visit (INDEPENDENT_AMBULATORY_CARE_PROVIDER_SITE_OTHER): Payer: Medicaid Other | Admitting: Obstetrics & Gynecology

## 2020-04-03 ENCOUNTER — Encounter: Payer: Self-pay | Admitting: Obstetrics & Gynecology

## 2020-04-03 MED ORDER — HYDROCHLOROTHIAZIDE 25 MG PO TABS: 25.0000 mg | ORAL_TABLET | Freq: Every day | ORAL | 1 refills | Status: DC | PRN

## 2020-04-03 MED ORDER — OXYCODONE-ACETAMINOPHEN 5-325 MG PO TABS
1.0000 | ORAL_TABLET | ORAL | 0 refills | Status: AC | PRN
Start: 2020-04-03 — End: 2021-04-03

## 2020-04-03 MED ORDER — FERROUS SULFATE 325 (65 FE) MG PO TABS: 325.0000 mg | ORAL_TABLET | Freq: Every day | ORAL | 4 refills | Status: DC

## 2020-04-03 NOTE — Patient Instructions (Signed)
Postpartum Tubal Ligation, Care After The following information offers guidance on how to care for yourself after your procedure. Your health care provider may also give you more specific instructions. If you have problems or questions, contact your health care provider. What can I expect after the procedure? After the procedure, you may have:  A sore throat if general anesthesia was used.  Bruising or pain in your back.  Nausea or vomiting.  Dizziness.  Mild abdominal discomfort or pain, such as cramping, gas pain, or feeling bloated.  Soreness around the incision area.  Tiredness.  Pain in your shoulders. This is caused by the gas that was used during the procedure. Follow these instructions at home: Medicines  Ask your health care provider if the medicine prescribed to you: ? Requires you to avoid driving or using heavy machinery. ? Can cause constipation. You may need to take these actions to prevent or treat constipation:  Drink enough fluid to keep your urine pale yellow.  Take over-the-counter or prescription medicines.  Eat foods that are high in fiber, such as beans, whole grains, and fresh fruits and vegetables.  Limit foods that are high in fat and processed sugars, such as fried or sweet foods.  Do not take aspirin because it can cause bleeding. Activity  Rest for the remainder of the day.  Avoid sitting for a long time without moving. Get up to take short walks every 1-2 hours. This is important to improve blood flow and breathing. Ask for help if you feel weak or unsteady.  Do not have sex, douche, or put a tampon or anything else in your vagina for 6 weeks or as long as told by your health care provider.  Do not lift anything that is heavier than your baby for 2 weeks, or the limit that you are told, until your health care provider says that it is safe.  Return to your normal activities as told by your health care provider. Ask your health care provider  what activities are safe for you. Incision care  Follow instructions from your health care provider about how to take care of your incision. Make sure you: ? Wash your hands with soap and water for at least 20 seconds before and after you change your bandage (dressing). If soap and water are not available, use hand sanitizer. ? Change your dressing as told by your health care provider. ? Leave stitches (sutures), skin glue, or adhesive strips in place. These skin closures may need to stay in place for 2 weeks or longer. If adhesive strip edges start to loosen and curl up, you may trim the loose edges. Do not remove adhesive strips completely unless your health care provider tells you to do that.  Check your incision area every day for signs of infection. Check for: ? Redness, swelling, or more pain. ? Fluid or blood. ? Warmth. ? Pus or a bad smell.      Other Instructions  Do not take baths, swim, or use a hot tub until your health care provider approves. Ask your health care provider if you may take showers. You may only be allowed to take sponge baths.  Keep all follow-up visits. This is important. Contact a health care provider if:  You have signs of infection, such as: ? Redness, swelling, or more pain around your incision. ? Fluid or blood coming from your incision. ? Your incision feels warm to the touch. ? You have pus or a bad smell coming  from your incision.  Your pain does not improve after 2-3 days.  The edges of your incision break open after the sutures have been removed.  You have a rash.  You repeatedly become dizzy or lightheaded.  You have pain and pain medicine is not helping.  You are constipated. Get help right away if:  You have a fever or chills.  You faint.  You have pain in your abdomen that gets worse, and the pain is not relieved by pain medicine.  You have shortness of breath or trouble breathing.  You have chest pain, leg pain, or leg  swelling.  You have ongoing nausea or diarrhea. These symptoms may represent a serious problem that is an emergency. Do not wait to see if the symptoms will go away. Get medical help right away. Call your local emergency services (911 in the [place] Do not drive yourself to the hospital. Summary  Mild abdominal discomfort is common after this procedure.  Contact your health care provider if you experience problems or have concerns.  Do not lift anything that is heavier than your baby for 2 weeks, or the limit that you are told, until your health care provider says that it is safe.  Keep all follow-up visits. This is important. This information is not intended to replace advice given to you by your health care provider. Make sure you discuss any questions you have with your health care provider. Document Revised: 09/17/2019 Document Reviewed: 09/17/2019 Elsevier Patient Education  2021 [employer].

## 2020-04-03 NOTE — Progress Notes (Signed)
  Postoperative Follow-up Patient presents post op from [facility] and Bakri Balloon placement folowed by removal for delayed but significant postpartum hemorrhage, a few days ago.  She also received 4 U transfusion.  Subjective: Patient reports some improvement in her preop symptoms.  Minimal bleeding today and over weekend (she is still one week post partum, and post op from BTL as well).    Eating a regular diet without difficulty. Pain is controlled with current analgesics. Medications being used: ibuprofen (OTC) and narcotic analgesics including Percocet.  Activity: normal activities of daily living. Patient reports additional symptom's since surgery of None.  Objective: BP 130/80   Ht 5\' 6"  (1.676 m)   Wt 236 lb (107 kg)   BMI 38.09 kg/m  Physical Exam Constitutional:      General: She is not in acute distress.    Appearance: She is well-developed.  Cardiovascular:     Rate and Rhythm: Normal rate.  Pulmonary:     Effort: Pulmonary effort is normal.  Abdominal:     General: There is no distension.     Palpations: Abdomen is soft.     Tenderness: There is no abdominal tenderness.     Comments: Incision Healing Well   Musculoskeletal:        General: Normal range of motion.  Neurological:     Mental Status: She is alert and oriented to person, place, and time.     Cranial Nerves: No cranial nerve deficit.  Skin:    General: Skin is warm and dry.     Assessment: s/p :  D&C, BTL progressing well  Plan: Patient has done well after surgery with no apparent complications.  I have discussed the post-operative course to date, and the expected progress moving forward.  The patient understands what complications to be concerned about.  I will see the patient in routine follow up, or sooner if needed.    Activity plan: No restriction.  Pelvic rest. Discussed hospital course and recovery Iron and monitoring for s/sx anemia Hemoglobin today S/p BTL Infant doing well No s/sx  depression  04/03/2020, 9:03 AM

## 2020-04-04 ENCOUNTER — Telehealth: Payer: Self-pay

## 2020-04-04 LAB — HEMOGLOBIN: Hemoglobin: 7 g/dL — CL (ref 11.1–15.9)

## 2020-04-04 NOTE — Telephone Encounter (Signed)
Tracey Christensen from [employer] calling for delivery date, type of delivery, next ov, est return to work date. [phone] x [phone]  Left detailed msg delivered 03/23/20 vaginally; was seen today and next ov 05/09/20; est return to work date plus or minus 05/03/20.

## 2020-04-07 NOTE — Anesthesia Postprocedure Evaluation (Signed)
Anesthesia Post Note  Patient: Tracey Christensen  Procedure(s) Performed: POST PARTUM TUBAL LIGATION (Bilateral Abdomen)  Patient location during evaluation: PACU Anesthesia Type: Spinal Level of consciousness: awake and alert Pain management: pain level controlled Vital Signs Assessment: post-procedure vital signs reviewed and stable Respiratory status: spontaneous breathing and respiratory function stable Cardiovascular status: blood pressure returned to baseline and stable Postop Assessment: spinal receding, no backache, patient able to bend at knees and no headache Anesthetic complications: no   No complications documented.   Last Vitals:  Vitals:   03/24/20 1416 03/24/20 1532  BP: 113/76 (!) 113/59  Pulse: 60 62  Resp: 16 16  Temp: 36.6 C 37.1 C  SpO2: 100% 99%    Last Pain:  Vitals:   03/24/20 1840  TempSrc:   PainSc: 3                  Lenard Simmer

## 2020-04-11 ENCOUNTER — Telehealth: Payer: Self-pay

## 2020-04-11 NOTE — Telephone Encounter (Signed)
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

## 2020-04-11 NOTE — Telephone Encounter (Signed)
Please advise 

## 2020-04-12 NOTE — Discharge Summary (Signed)
Physician Final Progress Note  Patient ID: Tracey Christensen MRN: [id number] DOB/AGE: May 07, 1990 30 y.o.  Admit date: 03/29/2020 Admitting provider: Malachy Mood, MD Discharge date: 03/31/2020  Admission Diagnoses: Delayed postpartum hemorrhage  Discharge Diagnoses:  Principal Problem:   Postpartum hemorrhage  30 y.o. presenting on 0/53/9767 after uncomplicated vaginal delivery 03/23/2020 and postpartum BTL 03/24/2020 with heavy vaginal bleeding.  Patient emergently taken to OR for D&C for retained POC based on ultrasound findings.  Had Bakri balloon placed intraoperatively.  Patient received 3 units of pRBC postoperatively.  Her bleeding remained light following removal of the bakri and she remained hemodynamically stable and was discharged in stable condition on POD1.   Consults: None  Significant Findings/ Diagnostic Studies: Recent Results (from the past 2160 hour(s))  POC Urinalysis Dipstick OB     Status: Normal   Collection Time: 01/17/20  9:08 AM  Result Value Ref Range   Color, UA     Clarity, UA     Glucose, UA Negative Negative   Bilirubin, UA     Ketones, UA     Spec Grav, UA     Blood, UA     pH, UA     POC,PROTEIN,UA Negative Negative, Trace, Small (1+), Moderate (2+), Large (3+), 4+   Urobilinogen, UA     Nitrite, UA     Leukocytes, UA     Appearance     Odor    POC Urinalysis Dipstick OB     Status: Normal   Collection Time: 02/02/20  2:23 PM  Result Value Ref Range   Color, UA     Clarity, UA     Glucose, UA Negative Negative   Bilirubin, UA     Ketones, UA     Spec Grav, UA     Blood, UA     pH, UA     POC,PROTEIN,UA Negative Negative, Trace, Small (1+), Moderate (2+), Large (3+), 4+   Urobilinogen, UA     Nitrite, UA     Leukocytes, UA     Appearance     Odor    Fetal fibronectin     Status: Abnormal   Collection Time: 02/02/20  4:06 PM  Result Value Ref Range   Fetal Fibronectin Positive (A) Negative  POC Urinalysis Dipstick OB     Status:  Normal   Collection Time: 02/14/20  8:29 AM  Result Value Ref Range   Color, UA     Clarity, UA     Glucose, UA Negative Negative   Bilirubin, UA     Ketones, UA     Spec Grav, UA     Blood, UA     pH, UA     POC,PROTEIN,UA Negative Negative, Trace, Small (1+), Moderate (2+), Large (3+), 4+   Urobilinogen, UA     Nitrite, UA     Leukocytes, UA     Appearance     Odor    POC Urinalysis Dipstick OB     Status: Normal   Collection Time: 02/22/20 10:20 AM  Result Value Ref Range   Color, UA     Clarity, UA     Glucose, UA Negative Negative   Bilirubin, UA     Ketones, UA     Spec Grav, UA     Blood, UA     pH, UA     POC,PROTEIN,UA Negative Negative, Trace, Small (1+), Moderate (2+), Large (3+), 4+   Urobilinogen, UA     Nitrite, UA  Leukocytes, UA     Appearance     Odor    POCT Urinalysis Dipstick     Status: Abnormal   Collection Time: 02/29/20  8:59 AM  Result Value Ref Range   Color, UA     Clarity, UA     Glucose, UA Negative Negative   Bilirubin, UA negative    Ketones, UA negative    Spec Grav, UA 1.025 1.010 - 1.025   Blood, UA negative    pH, UA 6.5 5.0 - 8.0   Protein, UA Negative Negative   Urobilinogen, UA 0.2 0.2 or 1.0 E.U./dL   Nitrite, UA trace    Leukocytes, UA Moderate (2+) (A) Negative   Appearance     Odor    POC Urinalysis Dipstick OB     Status: None   Collection Time: 02/29/20  9:01 AM  Result Value Ref Range   Color, UA     Clarity, UA     Glucose, UA Negative Negative   Bilirubin, UA     Ketones, UA     Spec Grav, UA     Blood, UA     pH, UA     POC,PROTEIN,UA Negative Negative, Trace, Small (1+), Moderate (2+), Large (3+), 4+   Urobilinogen, UA     Nitrite, UA     Leukocytes, UA     Appearance     Odor    Cervicovaginal ancillary only     Status: None   Collection Time: 02/29/20  9:31 AM  Result Value Ref Range   Trichomonas Negative    Chlamydia Negative    Neisseria Gonorrhea Negative    Comment Normal Reference  Ranger Chlamydia - Negative    Comment Normal Reference Range Trichomonas - Negative    Comment      Normal Reference Range Neisseria Gonorrhea - Negative  Culture, beta strep (group b only)     Status: Abnormal   Collection Time: 02/29/20  9:37 AM   Specimen: Urine   VR  Result Value Ref Range   Strep Gp B Culture Positive (A) Negative    Comment: Centers for Disease Control and Prevention (CDC) and American Congress of Obstetricians and Gynecologists (ACOG) guidelines for prevention of perinatal group B streptococcal (GBS) disease specify co-collection of a vaginal and rectal swab specimen to maximize sensitivity of GBS detection. Per the CDC and ACOG, swabbing both the lower vagina and rectum substantially increases the yield of detection compared with sampling the vagina alone. Penicillin G, ampicillin, or cefazolin are indicated for intrapartum prophylaxis of perinatal GBS colonization. Reflex susceptibility testing should be performed prior to use of clindamycin only on GBS isolates from penicillin-allergic women who are considered a high risk for anaphylaxis. Treatment with vancomycin without additional testing is warranted if resistance to clindamycin is noted.   Urine Culture     Status: None   Collection Time: 02/29/20  9:37 AM   Specimen: Urine   UR  Result Value Ref Range   Urine Culture, Routine Final report    Organism ID, Bacteria Comment     Comment: Mixed urogenital flora Less than 10,000 colonies/mL   POC Urinalysis Dipstick OB     Status: Normal   Collection Time: 03/07/20  8:44 AM  Result Value Ref Range   Color, UA     Clarity, UA     Glucose, UA Negative Negative   Bilirubin, UA     Ketones, UA     Spec Grav, UA  Blood, UA     pH, UA     POC,PROTEIN,UA Negative Negative, Trace, Small (1+), Moderate (2+), Large (3+), 4+   Urobilinogen, UA     Nitrite, UA     Leukocytes, UA     Appearance     Odor    CBC     Status: Abnormal   Collection  Time: 03/10/20 10:46 AM  Result Value Ref Range   WBC 8.8 3.4 - 10.8 x10E3/uL   RBC 4.30 3.77 - 5.28 x10E6/uL   Hemoglobin 11.1 11.1 - 15.9 g/dL   Hematocrit 34.3 34.0 - 46.6 %   MCV 80 79 - 97 fL   MCH 25.8 (L) 26.6 - 33.0 pg   MCHC 32.4 31.5 - 35.7 g/dL   RDW 13.9 11.7 - 15.4 %   Platelets 231 150 - 450 x10E3/uL  Comprehensive metabolic panel     Status: Abnormal   Collection Time: 03/10/20 10:46 AM  Result Value Ref Range   Glucose 75 65 - 99 mg/dL   BUN 3 (L) 6 - 20 mg/dL   Creatinine, Ser 0.41 (L) 0.57 - 1.00 mg/dL    Comment:                **Effective March 13, 2020 [facility] will begin**                  reporting the 2021 CKD-EPI creatinine equation that                  estimates kidney function without a race variable.    GFR calc non Af Amer 139 >59 mL/min/1.73   GFR calc Af Amer 160 >59 mL/min/1.73    Comment: **In accordance with recommendations from the NKF-ASN Task force,**   [facility] is in the process of updating its eGFR calculation to the   2021 CKD-EPI creatinine equation that estimates kidney function   without a race variable.    BUN/Creatinine Ratio 7 (L) 9 - 23   Sodium 138 134 - 144 mmol/L   Potassium 4.2 3.5 - 5.2 mmol/L   Chloride 102 96 - 106 mmol/L   CO2 18 (L) 20 - 29 mmol/L   Calcium 8.6 (L) 8.7 - 10.2 mg/dL   Total Protein 6.4 6.0 - 8.5 g/dL   Albumin 3.5 (L) 3.9 - 5.0 g/dL   Globulin, Total 2.9 1.5 - 4.5 g/dL   Albumin/Globulin Ratio 1.2 1.2 - 2.2   Bilirubin Total 0.2 0.0 - 1.2 mg/dL   Alkaline Phosphatase 131 (H) 44 - 121 IU/L   AST 14 0 - 40 IU/L   ALT 8 0 - 32 IU/L  Protein / creatinine ratio, urine     Status: None   Collection Time: 03/10/20  2:43 PM  Result Value Ref Range   Creatinine, Urine 123.3 Not Estab. mg/dL   Protein, Ur 21.1 Not Estab. mg/dL   Protein/Creat Ratio 171 0 - 200 mg/g creat  POC Urinalysis Dipstick OB     Status: None   Collection Time: 03/13/20  9:02 AM  Result Value Ref Range   Color, UA     Clarity, UA      Glucose, UA Negative Negative   Bilirubin, UA     Ketones, UA     Spec Grav, UA     Blood, UA     pH, UA     POC,PROTEIN,UA Negative Negative, Trace, Small (1+), Moderate (2+), Large (3+), 4+   Urobilinogen, UA     Nitrite,  UA     Leukocytes, UA     Appearance     Odor    POC Urinalysis Dipstick OB     Status: Normal   Collection Time: 03/20/20  8:12 AM  Result Value Ref Range   Color, UA     Clarity, UA     Glucose, UA Negative Negative   Bilirubin, UA     Ketones, UA     Spec Grav, UA     Blood, UA     pH, UA     POC,PROTEIN,UA Trace Negative, Trace, Small (1+), Moderate (2+), Large (3+), 4+   Urobilinogen, UA     Nitrite, UA     Leukocytes, UA     Appearance     Odor    SARS CORONAVIRUS 2 (TAT 6-24 HRS) Nasopharyngeal Nasopharyngeal Swab     Status: None   Collection Time: 03/21/20 10:19 AM   Specimen: Nasopharyngeal Swab  Result Value Ref Range   SARS Coronavirus 2 NEGATIVE NEGATIVE    Comment: (NOTE) SARS-CoV-2 target nucleic acids are NOT DETECTED.  The SARS-CoV-2 RNA is generally detectable in upper and lower respiratory specimens during the acute phase of infection. Negative results do not preclude SARS-CoV-2 infection, do not rule out co-infections with other pathogens, and should not be used as the sole basis for treatment or other patient management decisions. Negative results must be combined with clinical observations, patient history, and epidemiological information. The expected result is Negative.  Fact Sheet for Patients: SugarRoll.be  Fact Sheet for Healthcare Providers: https://www.woods-mathews.com/  This test is not yet approved or cleared by the [place] FDA and  has been authorized for detection and/or diagnosis of SARS-CoV-2 by FDA under an Emergency Use Authorization (EUA). This EUA will remain  in effect (meaning this test can be used) for the duration of the COVID-19 declaration under  Se ction 564(b)(1) of the Act, 21 U.S.C. section 360bbb-3(b)(1), unless the authorization is terminated or revoked sooner.  Performed at [facility], [place] [address] [place] [address]   CBC     Status: Abnormal   Collection Time: 03/23/20  8:54 AM  Result Value Ref Range   WBC 7.4 4.0 - 10.5 K/uL   RBC 4.22 3.87 - 5.11 MIL/uL   Hemoglobin 10.8 (L) 12.0 - 15.0 g/dL   HCT 33.2 (L) 36.0 - 46.0 %   MCV 78.7 (L) 80.0 - 100.0 fL   MCH 25.6 (L) 26.0 - 34.0 pg   MCHC 32.5 30.0 - 36.0 g/dL   RDW 14.2 11.5 - 15.5 %   Platelets 243 150 - 400 K/uL   nRBC 0.0 0.0 - 0.2 %    Comment: Performed at [facility], [address] [place], [place] [address]  Type and screen     Status: None   Collection Time: 03/23/20  8:54 AM  Result Value Ref Range   ABO/RH(D) A POS    Antibody Screen NEG    Sample Expiration      03/26/2020,2359 Performed at [facility], [address] [place], [place] [address]   RPR     Status: None   Collection Time: 03/23/20  8:54 AM  Result Value Ref Range   RPR Ser Ql NON REACTIVE NON REACTIVE    Comment: Performed at [facility], [address] [place], [place] [address]  Protein / creatinine ratio, urine     Status: None   Collection Time: 03/23/20  8:54 AM  Result Value Ref Range   Creatinine, Urine 115 mg/dL   Total Protein, Urine 16 mg/dL    Comment: NO NORMAL RANGE ESTABLISHED FOR THIS TEST   Protein Creatinine Ratio 0.14 0.00 - 0.15 mg/mg[Cre]    Comment: Performed at [facility], [address] [place], [place] [address]  Comprehensive metabolic panel     Status: Abnormal   Collection Time: 03/23/20  8:54 AM  Result Value Ref Range   Sodium 136 135 - 145 mmol/L   Potassium 3.7 3.5 - 5.1 mmol/L   Chloride 108 98 - 111 mmol/L   CO2 20 (L) 22 - 32 mmol/L   Glucose, Bld 85 70 - 99 mg/dL    Comment: Glucose reference range applies only to samples taken after fasting for at least 8 hours.   BUN <5  (L) 6 - 20 mg/dL   Creatinine, Ser 0.45 0.44 - 1.00 mg/dL   Calcium 8.5 (L) 8.9 - 10.3 mg/dL   Total Protein 6.6 6.5 - 8.1 g/dL   Albumin 2.8 (L) 3.5 - 5.0 g/dL   AST 13 (L) 15 - 41 U/L   ALT 9 0 - 44 U/L   Alkaline Phosphatase 112 38 - 126 U/L   Total Bilirubin 0.5 0.3 - 1.2 mg/dL   GFR, Estimated >60 >60 mL/min    Comment: (NOTE) Calculated using the CKD-EPI Creatinine Equation (2021)    Anion gap 8 5 - 15    Comment: Performed at [facility], [address] [place], [place] [address]  ABO/Rh     Status: None   Collection Time: 03/23/20 10:48 AM  Result Value Ref Range   ABO/RH(D)      A POS Performed at [facility], [address] [place], [place] [address]   CBC     Status: Abnormal   Collection Time: 03/24/20  5:25 AM  Result Value Ref Range   WBC 11.2 (H) 4.0 - 10.5 K/uL   RBC 3.82 (L) 3.87 - 5.11 MIL/uL   Hemoglobin 9.8 (L) 12.0 - 15.0 g/dL   HCT 29.7 (L) 36.0 - 46.0 %   MCV 77.7 (L) 80.0 - 100.0 fL   MCH 25.7 (L) 26.0 - 34.0 pg   MCHC 33.0 30.0 - 36.0 g/dL   RDW 14.0 11.5 - 15.5 %   Platelets 207 150 - 400 K/uL   nRBC 0.0 0.0 - 0.2 %    Comment: Performed at [facility], [address] [place], [place] [address]  Surgical pathology     Status: None   Collection Time: 03/24/20 11:41 AM  Result Value Ref Range   SURGICAL PATHOLOGY      SURGICAL PATHOLOGY CASE: [phone] PATIENT: GGYIRS Cosey Surgical Pathology Report     Specimen Submitted: A. Tube segments, bilateral  Clinical History: Desires sterilization    DIAGNOSIS: A. FALLOPIAN TUBE SEGMENTS, BILATERAL; STERILIZATION: - BILATERAL FALLOPIAN TUBES WITH NO SIGNIFICANT HISTOPATHOLOGIC CHANGE, SEEN ON FULL CROSS SECTION X3 EACH.  GROSS DESCRIPTION: A. Labeled: Bilateral tube segments Received: Formalin Collection time: 12:47 PM on 03/24/2020 Placed into formalin time: 12:50 PM on 03/24/2020 Size: Fallopian tube A: 1.8 cm long by 0.6 cm in diameter;  fallopian tube B: 2.1 cm long by 0.6 cm in diameter Other findings: The fallopian tubes have tan-pink, smooth, and glistening serosal surfaces.  Fimbria are absent.  The lumens are pinpoint and patent.  Fallopian tube B is inked blue for differentiation.  Block summary: 1 - representative fallopian tube A cross-sections  2 - representative fallopian tube B cross-sections  RB 03/24/2020   Final Diagnosis performed by Allena Napoleon, MD.   Electronically signed 03/27/2020 8:19:38AM The electronic signature indicates that the named Attending Pathologist has evaluated the specimen Technical component performed at [facility], [address] [place], [place] [address] Lab: [phone] Dir: Rush Farmer, MD, MMM  Professional component performed at [facility], [facility], [place], [place], [place] [address] Lab: [phone] Dir: Dellia Nims. Rubinas, MD   CBC     Status: Abnormal   Collection Time: 03/24/20  6:32 PM  Result Value Ref Range   WBC 13.2 (H) 4.0 - 10.5 K/uL   RBC 4.09 3.87 - 5.11 MIL/uL   Hemoglobin 10.7 (L) 12.0 - 15.0 g/dL   HCT 32.0 (L) 36.0 - 46.0 %   MCV 78.2 (L) 80.0 - 100.0 fL   MCH 26.2 26.0 - 34.0 pg   MCHC 33.4 30.0 - 36.0 g/dL   RDW 14.4 11.5 - 15.5 %   Platelets 205 150 - 400 K/uL   nRBC 0.0 0.0 - 0.2 %    Comment: Performed at [facility], [address] [place], [place] [address]  CBC with Differential     Status: Abnormal   Collection Time: 03/29/20 12:22 AM  Result Value Ref Range   WBC 6.0 4.0 - 10.5 K/uL   RBC 3.69 (L) 3.87 - 5.11 MIL/uL   Hemoglobin 9.4 (L) 12.0 - 15.0 g/dL   HCT 29.2 (L) 36.0 - 46.0 %   MCV 79.1 (L) 80.0 - 100.0 fL   MCH 25.5 (L) 26.0 - 34.0 pg   MCHC 32.2 30.0 - 36.0 g/dL   RDW 14.4 11.5 - 15.5 %   Platelets 179 150 - 400 K/uL   nRBC 0.3 (H) 0.0 - 0.2 %   Neutrophils Relative % 61 %   Neutro Abs 3.6 1.7 - 7.7 K/uL   Lymphocytes Relative 28 %   Lymphs Abs 1.7 0.7 - 4.0 K/uL   Monocytes Relative 7  %   Monocytes Absolute 0.4 0.1 - 1.0 K/uL   Eosinophils Relative 3 %   Eosinophils Absolute 0.2 0.0 - 0.5 K/uL   Basophils Relative 1 %   Basophils Absolute 0.0 0.0 - 0.1 K/uL   Immature Granulocytes 0 %   Abs Immature Granulocytes 0.02 0.00 - 0.07 K/uL    Comment: Performed at [facility], [address] [place], [place] [address]  Basic metabolic panel     Status: Abnormal   Collection Time: 03/29/20 12:22 AM  Result Value Ref Range   Sodium 138 135 - 145 mmol/L   Potassium 4.1 3.5 - 5.1 mmol/L   Chloride 109 98 - 111 mmol/L   CO2 23 22 - 32 mmol/L   Glucose, Bld 95 70 - 99 mg/dL    Comment: Glucose reference range applies only to samples taken after fasting for at least 8 hours.   BUN 16 6 - 20 mg/dL   Creatinine, Ser 0.94 0.44 - 1.00 mg/dL   Calcium 8.4 (L) 8.9 - 10.3 mg/dL   GFR, Estimated >60 >60 mL/min    Comment: (NOTE) Calculated using the CKD-EPI Creatinine Equation (2021)    Anion gap 6 5 - 15    Comment: Performed at [facility], [address] [place], [place] [address]  hCG, quantitative, pregnancy     Status: Abnormal   Collection Time: 03/29/20 12:22 AM  Result Value Ref Range   hCG, Beta Chain, Quant, S  155 (H) <5 mIU/mL    Comment:          GEST. AGE      CONC.  (mIU/mL)   <=1 WEEK        5 - 50     2 WEEKS       50 - 500     3 WEEKS       100 - 10,000     4 WEEKS     1,000 - 30,000     5 WEEKS     3,500 - 115,000   6-8 WEEKS     12,000 - 270,000    12 WEEKS     15,000 - 220,000        FEMALE AND NON-PREGNANT FEMALE:     LESS THAN 5 mIU/mL Performed at [facility], [address] [place], [place] [address]   Type and screen Ordered by PROVIDER DEFAULT     Status: None   Collection Time: 03/29/20  2:05 AM  Result Value Ref Range   ABO/RH(D) A POS    Antibody Screen NEG    Sample Expiration 04/01/2020,2359    Unit Number [id number]    Blood Component Type RED CELLS,LR    Unit division 00    Status of Unit  ISSUED,FINAL    Transfusion Status OK TO TRANSFUSE    Crossmatch Result Compatible    Unit Number [id number]    Blood Component Type RED CELLS,LR    Unit division 00    Status of Unit ISSUED,FINAL    Transfusion Status OK TO TRANSFUSE    Crossmatch Result Compatible    Unit Number [id number]    Blood Component Type RED CELLS,LR    Unit division 00    Status of Unit ISSUED,FINAL    Transfusion Status OK TO TRANSFUSE    Crossmatch Result Compatible    Unit Number [id number]    Blood Component Type RED CELLS,LR    Unit division 00    Status of Unit ISSUED,FINAL    Transfusion Status OK TO TRANSFUSE    Crossmatch Result      Compatible Performed at [facility], [address], [place],  [address]    Unit Number [id number]    Blood Component Type RED CELLS,LR    Unit division 00    Status of Unit REL FROM [facility]    Transfusion Status OK TO TRANSFUSE    Crossmatch Result Compatible    Unit Number [id number]    Blood Component Type RED CELLS,LR    Unit division 00    Status of Unit REL FROM [facility]    Transfusion Status OK TO TRANSFUSE    Crossmatch Result Compatible    Unit Number [id number]    Blood Component Type RED CELLS,LR    Unit division 00    Status of Unit REL FROM [facility]    Transfusion Status OK TO TRANSFUSE    Crossmatch Result Compatible   BPAM RBC     Status: None   Collection Time: 03/29/20  2:05 AM  Result Value Ref Range   ISSUE DATE / TIME 552080223361    Blood Product Unit Number [id number]    PRODUCT CODE [id number]    Unit Type and Rh 6200    Blood Product Expiration Date 670141030131    ISSUE DATE / TIME 438887579728    Blood Product Unit Number [id number]    PRODUCT CODE [id number]    Unit Type and Rh 6200  Blood Product Expiration Date 588502774128    ISSUE DATE / TIME 786767209470    Blood Product Unit Number [id number]    PRODUCT CODE [id number]    Unit Type and Rh 5681    Blood Product Expiration Date  275170017494    ISSUE DATE / TIME 496759163846    Blood Product Unit Number [id number]    PRODUCT CODE [id number]    Unit Type and Rh 9030    Blood Product Expiration Date 092330076226    ISSUE DATE / TIME 333545625638    Blood Product Unit Number [id number]    PRODUCT CODE [id number]    Unit Type and Rh 9741    Blood Product Expiration Date 638453646803    Blood Product Unit Number [id number]    PRODUCT CODE [id number]    Unit Type and Rh 4503    Blood Product Expiration Date 888280034917    Blood Product Unit Number [id number]    PRODUCT CODE [id number]    Unit Type and Rh 6200    Blood Product Expiration Date 707867544920   Hemoglobin and hematocrit, blood     Status: Abnormal   Collection Time: 03/29/20  3:56 AM  Result Value Ref Range   Hemoglobin 8.9 (L) 12.0 - 15.0 g/dL   HCT 27.6 (L) 36.0 - 46.0 %    Comment: Performed at [facility], [address] [place], [place] [address]  Resp Panel by RT-PCR (Flu A&B, Covid) Nasopharyngeal Swab     Status: None   Collection Time: 03/29/20  3:56 AM   Specimen: Nasopharyngeal Swab; Nasopharyngeal(NP) swabs in vial transport medium  Result Value Ref Range   SARS Coronavirus 2 by RT PCR NEGATIVE NEGATIVE    Comment: (NOTE) SARS-CoV-2 target nucleic acids are NOT DETECTED.  The SARS-CoV-2 RNA is generally detectable in upper respiratory specimens during the acute phase of infection. The lowest concentration of SARS-CoV-2 viral copies this assay can detect is 138 copies/mL. A negative result does not preclude SARS-Cov-2 infection and should not be used as the sole basis for treatment or other patient management decisions. A negative result may occur with  improper specimen collection/handling, submission of specimen other than nasopharyngeal swab, presence of viral mutation(s) within the areas targeted by this assay, and inadequate number of viral copies(<138 copies/mL). A negative result must be combined  with clinical observations, patient history, and epidemiological information. The expected result is Negative.  Fact Sheet for Patients:  EntrepreneurPulse.com.au  Fact Sheet for Healthcare Providers:  IncredibleEmployment.be  This test is no t yet approved or cleared by the [place] FDA and  has been authorized for detection and/or diagnosis of SARS-CoV-2 by FDA under an Emergency Use Authorization (EUA). This EUA will remain  in effect (meaning this test can be used) for the duration of the COVID-19 declaration under Section 564(b)(1) of the Act, 21 U.S.C.section 360bbb-3(b)(1), unless the authorization is terminated  or revoked sooner.       Influenza A by PCR NEGATIVE NEGATIVE   Influenza B by PCR NEGATIVE NEGATIVE    Comment: (NOTE) The Xpert Xpress SARS-CoV-2/FLU/RSV plus assay is intended as an aid in the diagnosis of influenza from Nasopharyngeal swab specimens and should not be used as a sole basis for treatment. Nasal washings and aspirates are unacceptable for Xpert Xpress SARS-CoV-2/FLU/RSV testing.  Fact Sheet for Patients: EntrepreneurPulse.com.au  Fact Sheet for Healthcare Providers: IncredibleEmployment.be  This test is not yet approved or cleared by the [place] FDA and has been authorized for detection and/or diagnosis  of SARS-CoV-2 by FDA under an Emergency Use Authorization (EUA). This EUA will remain in effect (meaning this test can be used) for the duration of the COVID-19 declaration under Section 564(b)(1) of the Act, 21 U.S.C. section 360bbb-3(b)(1), unless the authorization is terminated or revoked.  Performed at [facility], [address] [place], [place] [address]   Prepare RBC (crossmatch)     Status: None   Collection Time: 03/29/20  6:11 AM  Result Value Ref Range   Order Confirmation      ORDER PROCESSED BY BLOOD BANK Performed at [facility], [address] [place], [place] [address]   Surgical pathology     Status: None   Collection Time: 03/29/20  6:32 AM  Result Value Ref Range   SURGICAL PATHOLOGY      SURGICAL PATHOLOGY CASE: [id number] PATIENT: [facility] Surgical Pathology Report     Specimen Submitted: A. Products of conception, retained  Clinical History: Retained products of conception    DIAGNOSIS: A. UTERUS; DILATATION AND EVACUATION: - PREDOMINANTLY FRAGMENTS OF FIBROMUSCULAR AND FIBROVASCULAR TISSUE WITH ASSOCIATED HEMORRHAGE AND ORGANIZING FIBRIN. - FOCAL GLANDULAR TISSUE WITH STROMAL DECIDUALIZATION. - NO DEFINITE CHORIONIC VILLI IDENTIFIED. - NO EVIDENCE OF MALIGNANCY.  GROSS DESCRIPTION: A. Labeled: Retained products of conception Received: Formalin Collection time: 6:33 AM on 03/29/2020 Placed into formalin time: 6:46 AM on 03/29/2020 Tissue fragment(s): Multiple Size: Aggregate, 15 x 15 x 2.6 cm Villous tissue: There are scattered potential areas of villous tissue. Fetal tissue: None grossly appreciated. Comment: Received on Telfa pads and within a clear mesh collection device are fragments of tan-pink soft tissue, tan-pink firm tis sue, and blood clot.  Block summary: 1 - 5 - representative sections of soft and firm tissue      1 - potential villous tissue  RB 03/29/2020   Final Diagnosis performed by Allena Napoleon, MD.   Electronically signed 03/30/2020 9:45:29AM The electronic signature indicates that the named Attending Pathologist has evaluated the specimen Technical component performed at [facility], [address] [place], [place] [address] Lab: [phone] Dir: Rush Farmer, MD, MMM  Professional component performed at [facility], [facility], [place], [place], [place] [address] Lab: [id number] Dir: Dellia Nims. Rubinas, MD   CBC     Status: Abnormal   Collection Time: 03/29/20  7:11 AM  Result Value Ref Range   WBC 17.2 (H) 4.0 -  10.5 K/uL   RBC 2.78 (L) 3.87 - 5.11 MIL/uL   Hemoglobin 7.5 (L) 12.0 - 15.0 g/dL   HCT 22.7 (L) 36.0 - 46.0 %   MCV 81.7 80.0 - 100.0 fL   MCH 27.0 26.0 - 34.0 pg   MCHC 33.0 30.0 - 36.0 g/dL   RDW 14.0 11.5 - 15.5 %   Platelets 164 150 - 400 K/uL   nRBC 0.0 0.0 - 0.2 %    Comment: Performed at [facility], [address], [place], [place] [address]  Prepare RBC (crossmatch)     Status: None   Collection Time: 03/29/20  7:24 AM  Result Value Ref Range   Order Confirmation      DUPLICATE REQUEST Performed at [facility], [address] [place],  [address]   CBC     Status: Abnormal   Collection Time: 03/29/20  1:34 PM  Result Value Ref Range   WBC 11.8 (H) 4.0 - 10.5 K/uL   RBC 2.77 (L) 3.87 - 5.11 MIL/uL   Hemoglobin 7.4 (L) 12.0 -  15.0 g/dL   HCT 88.3 (L) 26.2 - 35.6 %   MCV 79.1 (L) 80.0 - 100.0 fL   MCH 26.7 26.0 - 34.0 pg   MCHC 33.8 30.0 - 36.0 g/dL   RDW 68.8 71.0 - 97.8 %   Platelets 150 150 - 400 K/uL   nRBC 0.0 0.0 - 0.2 %    Comment: Performed at [facility], [address] [place], [place] [address]  CBC     Status: Abnormal   Collection Time: 03/29/20  7:43 PM  Result Value Ref Range   WBC 9.3 4.0 - 10.5 K/uL   RBC 2.48 (L) 3.87 - 5.11 MIL/uL   Hemoglobin 6.6 (L) 12.0 - 15.0 g/dL   HCT 48.0 (L) 82.3 - 98.8 %   MCV 79.8 (L) 80.0 - 100.0 fL   MCH 26.6 26.0 - 34.0 pg   MCHC 33.3 30.0 - 36.0 g/dL   RDW 30.0 33.0 - 89.9 %   Platelets 168 150 - 400 K/uL   nRBC 0.0 0.0 - 0.2 %    Comment: Performed at [facility], [address] [place], [place] [address]  Prepare RBC (crossmatch)     Status: None   Collection Time: 03/29/20  9:09 PM  Result Value Ref Range   Order Confirmation      DUPLICATE REQUEST Performed at [facility], [address] [place], [place] [address]   CBC     Status: Abnormal   Collection Time: 03/30/20  7:25 AM  Result Value Ref Range   WBC 8.4 4.0 - 10.5 K/uL   RBC 2.30  (L) 3.87 - 5.11 MIL/uL   Hemoglobin 6.2 (L) 12.0 - 15.0 g/dL   HCT 27.2 (L) 75.4 - 96.3 %   MCV 80.0 80.0 - 100.0 fL   MCH 27.0 26.0 - 34.0 pg   MCHC 33.7 30.0 - 36.0 g/dL   RDW 21.1 26.9 - 98.8 %   Platelets 136 (L) 150 - 400 K/uL   nRBC 0.0 0.0 - 0.2 %    Comment: Performed at [facility], [address] [place], [place] [address]  Prepare RBC (crossmatch)     Status: None   Collection Time: 03/30/20  9:00 AM  Result Value Ref Range   Order Confirmation      DUPLICATE REQUEST UNITS ALREADY AVAILABLE DAS Performed at [facility], [address] [place], [place] [address]   CBC     Status: Abnormal   Collection Time: 03/30/20  2:25 PM  Result Value Ref Range   WBC 9.4 4.0 - 10.5 K/uL   RBC 2.79 (L) 3.87 - 5.11 MIL/uL   Hemoglobin 7.9 (L) 12.0 - 15.0 g/dL    Comment: REPEATED TO VERIFY   HCT 22.6 (L) 36.0 - 46.0 %   MCV 81.0 80.0 - 100.0 fL   MCH 28.3 26.0 - 34.0 pg   MCHC 35.0 30.0 - 36.0 g/dL   RDW 17.9 97.9 - 70.6 %   Platelets 142 (L) 150 - 400 K/uL   nRBC 0.0 0.0 - 0.2 %    Comment: Performed at [facility], [address] [place], [place] [address]  Hemoglobin     Status: Abnormal   Collection Time: 04/03/20 12:37 PM  Result Value Ref Range   Hemoglobin 7.0 (LL) 11.1 - 15.9 g/dL     Procedures: [facility] 4/01  Discharge Condition: good  Disposition: Discharge disposition: 01-Home or Self Care       Diet:  Regular diet  Discharge Activity: Activity as tolerated  Discharge Instructions    Activity as tolerated   Complete by: As directed    Call MD for:   Complete by: As directed    For heavy vaginal bleeding greater than 1 pad an hour   Call MD for:  difficulty breathing, headache or visual disturbances   Complete by: As directed    Call MD for:  extreme fatigue   Complete by: As directed    Call MD for:  hives   Complete by: As directed    Call MD for:  persistant dizziness or light-headedness   Complete by: As  directed    Call MD for:  persistant nausea and vomiting   Complete by: As directed    Call MD for:  severe uncontrolled pain   Complete by: As directed    Call MD for:  temperature >100.4   Complete by: As directed    Diet general   Complete by: As directed      Allergies as of 03/30/2020   No Known Allergies     Medication List    TAKE these medications   ibuprofen 600 MG tablet Commonly known as: ADVIL Take 1 tablet (600 mg total) by mouth every 6 (six) hours.       Follow-up Information    Gae Dry, MD. Schedule an appointment as soon as possible for a visit in 1 week.   Specialty: Obstetrics and Gynecology Why: For Post Op Contact information: [address] [place] [address] [phone]               Signed: Malachy Mood 04/12/2020, 10:42 AM

## 2020-04-25 ENCOUNTER — Telehealth: Payer: Self-pay

## 2020-04-25 NOTE — Telephone Encounter (Signed)
Per [facility] If its open, or any other time. Most of my appts are short tomorrow, So double booking is ok here and there. Secure chat. Called and left voicemail for patient to call back to be scheduled.

## 2020-04-25 NOTE — Telephone Encounter (Signed)
Pt needs appt for UTI/yeast infection. Please see if there are any openings so pt can come in for appt

## 2020-04-26 ENCOUNTER — Ambulatory Visit (INDEPENDENT_AMBULATORY_CARE_PROVIDER_SITE_OTHER): Payer: Medicaid Other | Admitting: Obstetrics & Gynecology

## 2020-04-26 ENCOUNTER — Encounter: Payer: Self-pay | Admitting: Obstetrics & Gynecology

## 2020-04-26 ENCOUNTER — Other Ambulatory Visit (HOSPITAL_COMMUNITY)
Admission: RE | Admit: 2020-04-26 | Discharge: 2020-04-26 | Disposition: A | Discharge status: Home / Self Care | Payer: Medicaid Other | Source: Ambulatory Visit | Attending: Obstetrics & Gynecology | Admitting: Obstetrics & Gynecology

## 2020-04-26 ENCOUNTER — Other Ambulatory Visit: Payer: Self-pay

## 2020-04-26 VITALS — BP 120/80 | Ht 66.0 in | Wt 218.0 lb

## 2020-04-26 DIAGNOSIS — R300 Dysuria: Secondary | ICD-10-CM

## 2020-04-26 DIAGNOSIS — Z124 Encounter for screening for malignant neoplasm of cervix: Secondary | ICD-10-CM | POA: Diagnosis present

## 2020-04-26 DIAGNOSIS — N3000 Acute cystitis without hematuria: Secondary | ICD-10-CM

## 2020-04-26 LAB — POCT URINALYSIS DIPSTICK
Bilirubin, UA: NEGATIVE
Glucose, UA: NEGATIVE
Ketones, UA: NEGATIVE
Nitrite, UA: NEGATIVE
Protein, UA: NEGATIVE
Spec Grav, UA: 1.01 (ref 1.010–1.025)
Urobilinogen, UA: 0.2 E.U./dL
pH, UA: 5 (ref 5.0–8.0)

## 2020-04-26 MED ORDER — SULFAMETHOXAZOLE-TRIMETHOPRIM 800-160 MG PO TABS: 1.0000 | ORAL_TABLET | Freq: Two times a day (BID) | ORAL | 0 refills | Status: AC

## 2020-04-26 NOTE — Progress Notes (Signed)
  OBSTETRICS POSTPARTUM CLINIC PROGRESS NOTE  Subjective:     Tracey Christensen is a 30 y.o. [phone] female who presents for a postpartum visit. She is 6 weeks postpartum following a Term pregnancy or Pregnancy complicated by: prior preecalmpsia, obesity, and then PPH treated w D&C and Bakri; also had PP BTL and delivery by Vaginal problems after delivery including PPH.  I have fully reviewed the prenatal and intrapartum course. Anesthesia: epidural.  Postpartum course has been complicated by complicated by the PPH episode, now resolved.  Baby is feeding by Bottle and Breast.  Bleeding: patient has not  resumed menses.  Bowel function is normal. Bladder function is normal.  Patient is not sexually active. Contraception method desired is tubal ligation.  Postpartum depression screening: negative. Edinburgh 0.  The following portions of the patient's history were reviewed and updated as appropriate: allergies, current medications, past family history, past medical history, past social history, past surgical history and problem list.  Review of Systems Pertinent items are noted in HPI.  Objective:    BP 120/80   Ht 5\' 6"  (1.676 m)   Wt 218 lb (98.9 kg)   BMI 35.19 kg/m   General:  alert and no distress   Breasts:  inspection negative, no nipple discharge or bleeding, no masses or nodularity palpable  Lungs: clear to auscultation bilaterally  Heart:  regular rate and rhythm, S1, S2 normal, no murmur, click, rub or gallop  Abdomen: soft, non-tender; bowel sounds normal; no masses,  no organomegaly.  Well healed BTL incision   Vulva:  normal  Vagina: normal vagina, no discharge, exudate, lesion, or erythema  Cervix:  no cervical motion tenderness and no lesions  Corpus: normal size, contour, position, consistency, mobility, non-tender  Adnexa:  normal adnexa and no mass, fullness, tenderness  Rectal Exam: Not performed.          Assessment:  Post Partum Care visit 1. Dysuria - POCT  urinalysis dipstick - Urine Culture  2. Screening for cervical cancer - Cytology - PAP  3. Acute cystitis without hematuria Bactrim DS 3 days  Plan:  See orders and Patient Instructions Follow up in: 12 months or as needed.   , MD, Annamarie Major Ob/Gyn, [facility] 04/26/2020  4:57 PM

## 2020-05-01 ENCOUNTER — Other Ambulatory Visit (HOSPITAL_COMMUNITY)
Admission: RE | Admit: 2020-05-01 | Discharge: 2020-05-01 | Disposition: A | Discharge status: Home / Self Care | Payer: Medicaid Other | Source: Ambulatory Visit | Attending: Obstetrics and Gynecology | Admitting: Obstetrics and Gynecology

## 2020-05-01 ENCOUNTER — Encounter: Payer: Self-pay | Admitting: Obstetrics and Gynecology

## 2020-05-01 ENCOUNTER — Other Ambulatory Visit: Payer: Self-pay

## 2020-05-01 ENCOUNTER — Ambulatory Visit (INDEPENDENT_AMBULATORY_CARE_PROVIDER_SITE_OTHER): Payer: Medicaid Other | Admitting: Obstetrics and Gynecology

## 2020-05-01 VITALS — BP 140/90 | Ht 66.0 in | Wt 216.0 lb

## 2020-05-01 DIAGNOSIS — N949 Unspecified condition associated with female genital organs and menstrual cycle: Secondary | ICD-10-CM | POA: Insufficient documentation

## 2020-05-01 DIAGNOSIS — N898 Other specified noninflammatory disorders of vagina: Secondary | ICD-10-CM | POA: Diagnosis not present

## 2020-05-01 DIAGNOSIS — Z113 Encounter for screening for infections with a predominantly sexual mode of transmission: Secondary | ICD-10-CM

## 2020-05-01 DIAGNOSIS — Z87440 Personal history of urinary (tract) infections: Secondary | ICD-10-CM

## 2020-05-01 LAB — POCT WET PREP WITH KOH
Clue Cells Wet Prep HPF POC: NEGATIVE
KOH Prep POC: NEGATIVE
Trichomonas, UA: NEGATIVE
Yeast Wet Prep HPF POC: NEGATIVE

## 2020-05-01 LAB — POCT URINALYSIS DIPSTICK
Bilirubin, UA: NEGATIVE
Blood, UA: NEGATIVE
Glucose, UA: NEGATIVE
Ketones, UA: NEGATIVE
Leukocytes, UA: NEGATIVE
Nitrite, UA: NEGATIVE
Protein, UA: NEGATIVE
Spec Grav, UA: 1.02 (ref 1.010–1.025)
pH, UA: 5 (ref 5.0–8.0)

## 2020-05-01 LAB — URINE CULTURE

## 2020-05-01 LAB — CYTOLOGY - PAP
Comment: NEGATIVE
Diagnosis: NEGATIVE
High risk HPV: NEGATIVE

## 2020-05-01 NOTE — Patient Instructions (Signed)
I value your feedback and you entrusting us with your care. If you get a [facility] patient survey, I would appreciate you taking the time to let us know about your experience today. Thank you! ? ? ?

## 2020-05-01 NOTE — Progress Notes (Signed)
Patient, No Pcp Per (Inactive)   Chief Complaint  Patient presents with  . Urinary Tract Infection  . Vaginal Irritation    Discomfort, no discharge or itchiness or odor    HPI:      [name] is a 30 y.o. [phone] whose LMP was No LMP recorded., presents today for vaginal discomfort inside vaginal canal recently. No vag d/c, irritation, odor; just feels "not right". Pt had PP visit last wk with Dr. Tiburcio Pea (s/p vag delivery) and had sx; treated for UTI with bactrim due to dysuria. C&S pending. Pt denies any urin sx now. No LBP, pelvic pain, fevers. Was on several rounds abx after delivery so treated with monistat-1 last wk without sx change. She has been sex active since delivery, s/p TL; no new partners. No pain/bleeding/dryness. No BF. Would like STD testing. Awaiting pap results.    Past Medical History:  Diagnosis Date  . Gestational hypertension     Past Surgical History:  Procedure Laterality Date  . DILATION AND EVACUATION N/A 03/29/2020   Procedure: DILATATION AND EVACUATION;  Surgeon: Nadara Mustard, MD;  Location: [facility] ORS;  Service: Gynecology;  Laterality: N/A;  . HAND SURGERY Right   . TUBAL LIGATION Bilateral 03/24/2020   Procedure: POST PARTUM TUBAL LIGATION;  Surgeon: Vena Austria, MD;  Location: [facility] ORS;  Service: Gynecology;  Laterality: Bilateral;    Family History  Problem Relation Age of Onset  . Diabetes Maternal Grandmother   . Hypertension Paternal Grandmother   . Diabetes Paternal Grandfather     Social History   Socioeconomic History  . Marital status: Single    Spouse name: Not on file  . Number of children: Not on file  . Years of education: Not on file  . Highest education level: Not on file  Occupational History  . Not on file  Tobacco Use  . Smoking status: Never Smoker  . Smokeless tobacco: Never Used  Vaping Use  . Vaping Use: Never used  Substance and Sexual Activity  . Alcohol use: Not Currently  . Drug use: No   . Sexual activity: Yes    Partners: Male    Birth control/protection: None  Other Topics Concern  . Not on file  Social History Narrative  . Not on file   Social Determinants of Health   Financial Resource Strain: Not on file  Food Insecurity: Not on file  Transportation Needs: Not on file  Physical Activity: Not on file  Stress: Not on file  Social Connections: Not on file  Intimate Partner Violence: Not At Risk  . Fear of Current or Ex-Partner: No  . Emotionally Abused: No  . Physically Abused: No  . Sexually Abused: No    Outpatient Medications Prior to Visit  Medication Sig Dispense Refill  . ferrous sulfate (FERROUSUL) 325 (65 FE) MG tablet Take 1 tablet (325 mg total) by mouth daily with breakfast. 30 tablet 4  . hydrochlorothiazide (HYDRODIURIL) 25 MG tablet Take 1 tablet (25 mg total) by mouth daily as needed (For swelling). 15 tablet 1  . ibuprofen (ADVIL) 600 MG tablet Take 1 tablet (600 mg total) by mouth every 6 (six) hours. 30 tablet 0  . oxyCODONE-acetaminophen (PERCOCET) 5-325 MG tablet Take 1 tablet by mouth every 4 (four) hours as needed for severe pain. 15 tablet 0   No facility-administered medications prior to visit.      ROS:  Review of Systems  Constitutional: Negative for fever.  Gastrointestinal:  Negative for blood in stool, constipation, diarrhea, nausea and vomiting.  Genitourinary: Negative for dyspareunia, dysuria, flank pain, frequency, hematuria, urgency, vaginal bleeding, vaginal discharge and vaginal pain.  Musculoskeletal: Negative for back pain.  Skin: Negative for rash.    OBJECTIVE:   Vitals:  BP 140/90   Ht 5\' 6"  (1.676 m)   Wt 216 lb (98 kg)   Breastfeeding No   BMI 34.86 kg/m   Physical Exam Vitals reviewed.  Constitutional:      Appearance: She is well-developed.  Pulmonary:     Effort: Pulmonary effort is normal.  Genitourinary:    General: Normal vulva.     Pubic Area: No rash.      Labia:        Right: No  rash, tenderness or lesion.        Left: No rash, tenderness or lesion.      Vagina: Vaginal discharge present. No erythema or tenderness.     Cervix: Normal.     Uterus: Normal. Not enlarged and not tender.      Adnexa: Right adnexa normal and left adnexa normal.       Right: No mass or tenderness.         Left: No mass or tenderness.       Comments: NEG VAG EXAM EXCEPT VAG D/C Musculoskeletal:        General: Normal range of motion.     Cervical back: Normal range of motion.  Skin:    General: Skin is warm and dry.  Neurological:     General: No focal deficit present.     Mental Status: She is alert and oriented to person, place, and time.  Psychiatric:        Mood and Affect: Mood normal.        Behavior: Behavior normal.        Thought Content: Thought content normal.        Judgment: Judgment normal.     Results: Results for orders placed or performed in visit on 05/01/20 (from the past 24 hour(s))  POCT Wet Prep with KOH     Status: Normal   Collection Time: 05/01/20  2:03 PM  Result Value Ref Range   Trichomonas, UA Negative    Clue Cells Wet Prep HPF POC neg    Epithelial Wet Prep HPF POC     Yeast Wet Prep HPF POC neg    Bacteria Wet Prep HPF POC     RBC Wet Prep HPF POC     WBC Wet Prep HPF POC     KOH Prep POC Negative Negative  POCT Urinalysis Dipstick     Status: Normal   Collection Time: 05/01/20  2:04 PM  Result Value Ref Range   Color, UA yellow    Clarity, UA clear    Glucose, UA Negative Negative   Bilirubin, UA neg    Ketones, UA neg    Spec Grav, UA 1.020 1.010 - 1.025   Blood, UA neg    pH, UA 5.0 5.0 - 8.0   Protein, UA Negative Negative   Urobilinogen, UA     Nitrite, UA neg    Leukocytes, UA Negative Negative   Appearance     Odor       Assessment/Plan: Vaginal discomfort - Plan: Cervicovaginal ancillary only; neg exam except d/c with neg wet prep. Check culture for STDs/BV/yeast. Will f/u if abn. If neg, sx most likely due to healing  after SVD. Reassurance, f/u  prn.   Vaginal discharge - Plan: Cervicovaginal ancillary only, POCT Wet Prep with KOH; neg wet prep. Check culture.   Screening for STD (sexually transmitted disease) - Plan: Cervicovaginal ancillary only  History of UTI - Plan: POCT Urinalysis Dipstick; neg UA today. Awaiting C&S results. Will f/u if need to change abx but most likely fine due to normal UA today.    Return if symptoms worsen or fail to improve.  Juvon Teater B. Braileigh Landenberger, PA-C 05/01/2020 2:06 PM

## 2020-05-03 LAB — CERVICOVAGINAL ANCILLARY ONLY
Bacterial Vaginitis (gardnerella): POSITIVE — AB
Candida Glabrata: NEGATIVE
Candida Vaginitis: NEGATIVE
Chlamydia: NEGATIVE
Comment: NEGATIVE
Comment: NEGATIVE
Comment: NEGATIVE
Comment: NEGATIVE
Comment: NEGATIVE
Comment: NORMAL
Neisseria Gonorrhea: NEGATIVE
Trichomonas: NEGATIVE

## 2020-05-03 MED ORDER — METRONIDAZOLE 500 MG PO TABS: 500.0000 mg | ORAL_TABLET | Freq: Two times a day (BID) | ORAL | 0 refills | Status: AC

## 2020-05-03 NOTE — Addendum Note (Signed)
Addended by: Althea Grimmer B on: 05/03/2020 01:40 PM   Modules accepted: Orders

## 2020-05-09 ENCOUNTER — Ambulatory Visit: Payer: Medicaid Other | Admitting: Obstetrics & Gynecology

## 2021-02-26 ENCOUNTER — Ambulatory Visit: Admit: 2021-02-26 | Payer: Medicaid Other

## 2021-03-02 ENCOUNTER — Ambulatory Visit (INDEPENDENT_AMBULATORY_CARE_PROVIDER_SITE_OTHER): Payer: Medicaid Other | Admitting: Licensed Practical Nurse

## 2021-03-02 ENCOUNTER — Other Ambulatory Visit: Payer: Self-pay

## 2021-03-02 VITALS — BP 126/70 | Ht 66.0 in | Wt 228.0 lb

## 2021-03-02 DIAGNOSIS — R399 Unspecified symptoms and signs involving the genitourinary system: Secondary | ICD-10-CM | POA: Diagnosis not present

## 2021-03-02 DIAGNOSIS — R3989 Other symptoms and signs involving the genitourinary system: Secondary | ICD-10-CM

## 2021-03-02 DIAGNOSIS — N898 Other specified noninflammatory disorders of vagina: Secondary | ICD-10-CM

## 2021-03-02 LAB — POCT URINALYSIS DIPSTICK
Bilirubin, UA: NEGATIVE
Blood, UA: NEGATIVE
Glucose, UA: NEGATIVE
Ketones, UA: NEGATIVE
Leukocytes, UA: NEGATIVE
Nitrite, UA: NEGATIVE
Protein, UA: POSITIVE — AB
Spec Grav, UA: 1.025 (ref 1.010–1.025)
Urobilinogen, UA: 0.2 E.U./dL
pH, UA: 5 (ref 5.0–8.0)

## 2021-03-02 NOTE — Progress Notes (Signed)
°  HPI:      [name] is a 31 y.o. [phone] who LMP was Patient's last menstrual period was 02/24/2021., presents today for a problem visit.    She was seen at [facility] on Monday for UTI symptoms (urgency, urethral irritation, dysuria that started Sunday), was told that she had a UTI and was prescribed Macrobid, which she has started. Most of her symptoms have improved, but she still has urethral irritation. It feels irritated "all of the time".  Desires swabs today for vaginitis/STI. Declines bloodwork.    PMHx: She  has a past medical history of Gestational hypertension. Also,  has a past surgical history that includes Hand surgery (Right); Tubal ligation (Bilateral, 03/24/2020); and Dilation and evacuation (N/A, 03/29/2020)., family history includes Diabetes in her maternal grandmother and paternal grandfather; Hypertension in her paternal grandmother.,  reports that she has never smoked. She has never used smokeless tobacco. She reports that she does not currently use alcohol. She reports that she does not use drugs.  She has a current medication list which includes the following prescription(s): ferrous sulfate, hydrochlorothiazide, ibuprofen, oxycodone-acetaminophen, and [DISCONTINUED] famotidine. Also, has No Known Allergies.  Review of Systems  Constitutional:  Negative for chills and fever.  Respiratory: Negative.    Cardiovascular: Negative.   Gastrointestinal:  Negative for abdominal pain, nausea and vomiting.  Genitourinary:        Urinary symptoms improving    Objective: BP 126/70    Ht 5\' 6"  (1.676 m)    Wt 228 lb (103.4 kg)    LMP 02/24/2021    BMI 36.80 kg/m  Physical Exam Constitutional:      Appearance: Normal appearance.  Genitourinary:     Vulva normal.     Genitourinary Comments: Area around urethra irritated looking, no lesions or tenderness, urethra normal in appearance.   Pulmonary:     Effort: Pulmonary effort is normal.  Abdominal:     Tenderness: There  is no abdominal tenderness.  Neurological:     Mental Status: She is alert.    ASSESSMENT/PLAN:   Urethral irritation  Problem List Items Addressed This Visit   None Visit Diagnoses     Urethral pain    -  Primary   Relevant Orders   Herpes simplex virus culture   UTI symptoms       Relevant Orders   POCT Urinalysis Dipstick (Completed)   Vaginal irritation       Relevant Orders   NuSwab Vaginitis (VG)      Will create plan based on lab results  Could consider using OTC monistat while awaiting lab results.  04/24/2021, CNM  Carie Caddy, [facility]  03/02/21  4:47 PM

## 2021-03-05 ENCOUNTER — Telehealth: Payer: Self-pay

## 2021-03-05 LAB — HERPES SIMPLEX VIRUS CULTURE

## 2021-03-05 NOTE — Telephone Encounter (Signed)
Pt called needing results, they are not back yet pt aware.

## 2021-03-06 ENCOUNTER — Telehealth: Payer: Self-pay

## 2021-03-06 ENCOUNTER — Ambulatory Visit: Payer: Medicaid Other

## 2021-03-06 LAB — NUSWAB VAGINITIS (VG)
Candida albicans, NAA: NEGATIVE
Candida glabrata, NAA: NEGATIVE
Trich vag by NAA: NEGATIVE

## 2021-03-06 NOTE — Telephone Encounter (Signed)
Pt calling for results from Friday.  [phone]  Pt aware Herpes cx negative and other swab isn't back yet; hopefully in the next day or two.

## 2021-03-08 ENCOUNTER — Encounter: Payer: Self-pay | Admitting: Licensed Practical Nurse

## 2021-10-03 ENCOUNTER — Telehealth: Payer: Self-pay

## 2021-10-03 NOTE — Telephone Encounter (Signed)
Patient reports she had a BTL in 2022. She states she only has mild dark brown spotting with her periods, but her cramping during her cycle has become unbearable (worse than labor contractions). Advised needs appointment for evaluation.

## 2021-10-03 NOTE — Telephone Encounter (Signed)
Reached out to pt to schedule gyn visit.  Left message for pt to call back to schedule.

## 2021-10-09 ENCOUNTER — Ambulatory Visit: Payer: Medicaid Other

## 2021-10-09 ENCOUNTER — Telehealth: Payer: Self-pay

## 2021-10-09 NOTE — Telephone Encounter (Signed)
While prepping for client's [facility] family planning clinic appt this am, noticed she had a BTL in 2022. Call to client to counsel her STI testing and Immunization Clinic appts available to her, but not family planning as considered unable to conceive as s/p BTL. Appt cancelled per client request. Rich Number, RN

## 2021-10-12 ENCOUNTER — Telehealth: Payer: Self-pay

## 2021-10-12 NOTE — Telephone Encounter (Signed)
Client called / call transferred directly into clinic. Tracey Christensen is requesting a copy of her immunization record for school. Client states would like to pick up copy this am. Client counseled 2 copies of immunization record would be in an envelope and placed at check in booth across from elevator within 5 minutes where she could pick up. Copy of her immunization record sent for scanning. Rich Number, RN

## 2021-11-03 IMAGING — US US PELVIS COMPLETE
1 series · 13 of 25 positions shown · non-contrast
Comparison: Ultrasound Ob 12/06/2019.

CLINICAL DATA: Heavy vaginal bleeding. Concern for retained
products of conception. Vaginal delivery 03/23/2020. Beta HCG 155.
Tubal ligation 03/24/2020.

EXAM:
TRANSABDOMINAL ULTRASOUND OF PELVIS
TECHNIQUE: Transabdominal ultrasound examination of the pelvis was performed
including evaluation of the uterus, ovaries, adnexal regions, and
pelvic cul-de-sac.

[Series 1: us pelvis (transabdominal only) · 13 of 62 slices shown]
[im 1/62]
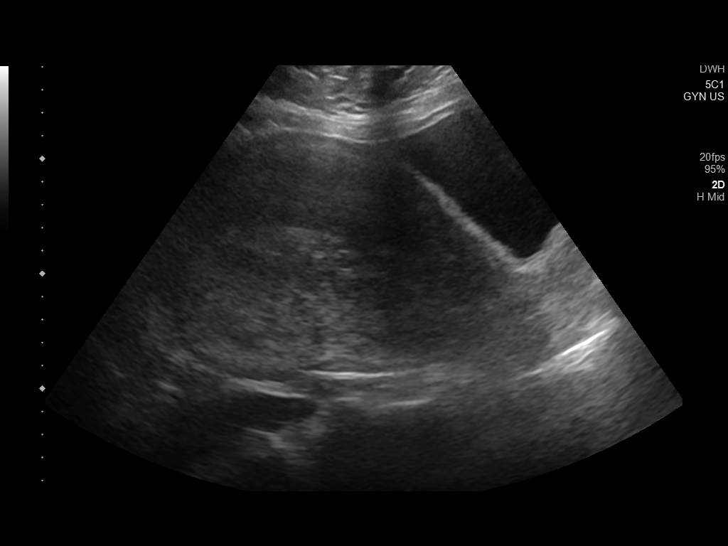
[im 6/62]
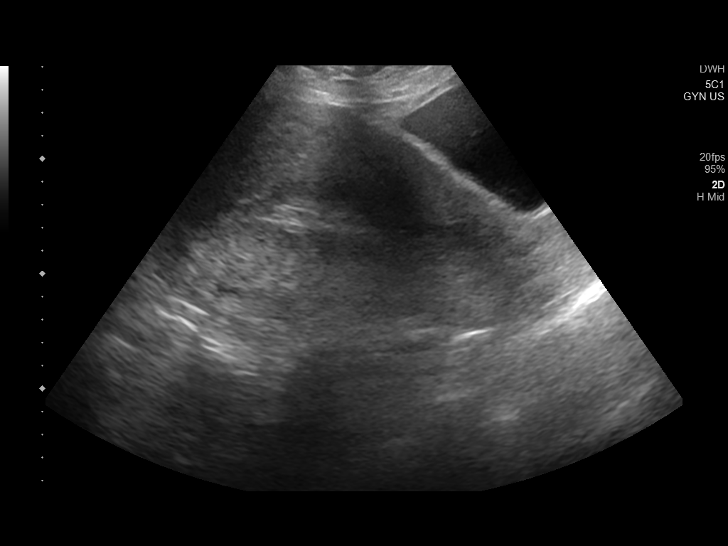
[im 11/62]
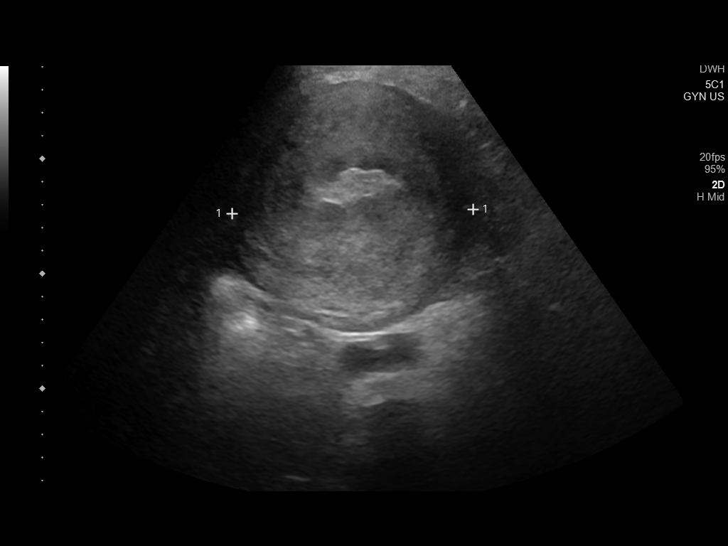
[im 16/62]
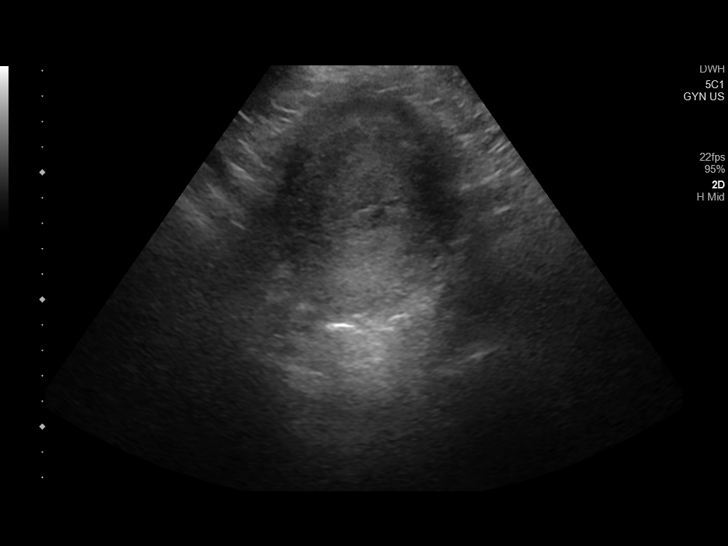
[im 21/62]
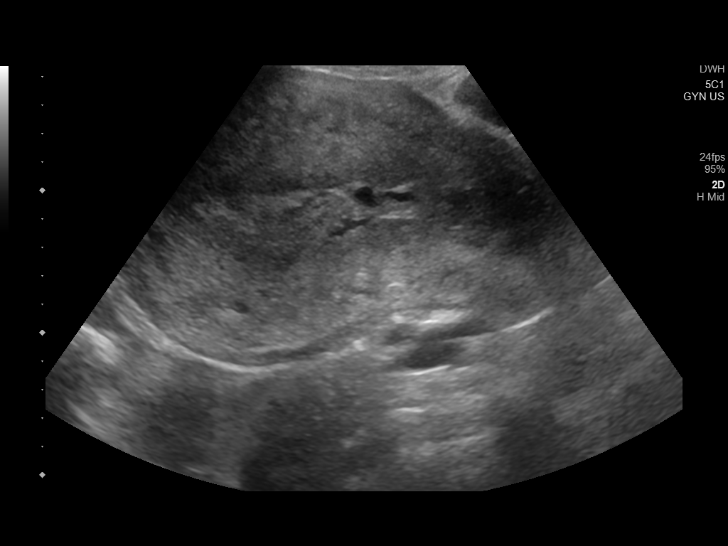
[im 26/62]
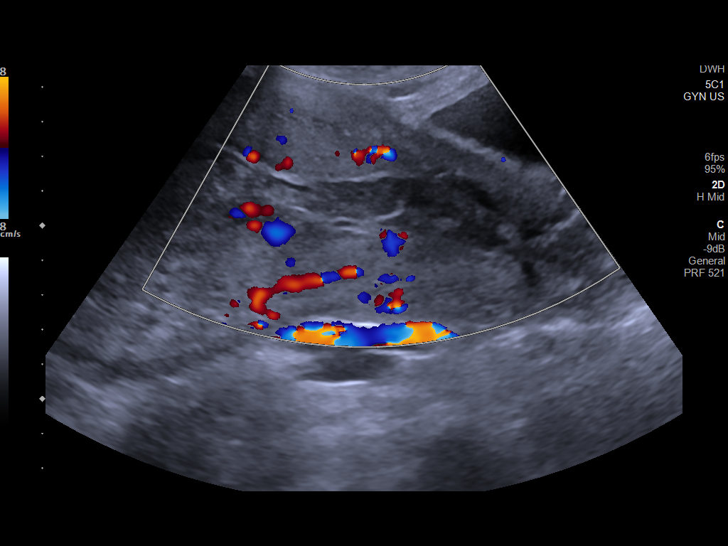
[im 31/62]
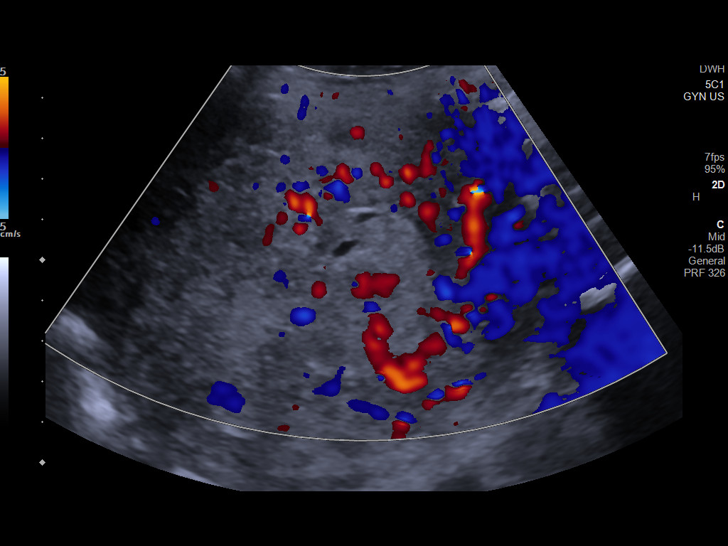
[im 36/62]
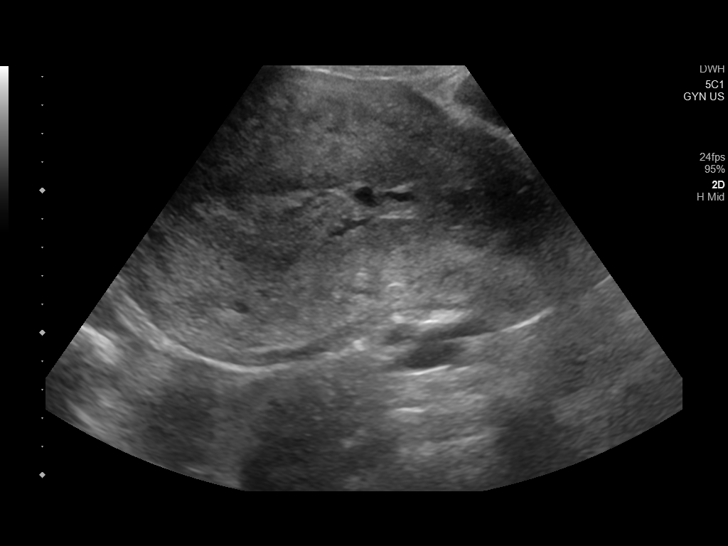
[im 41/62]
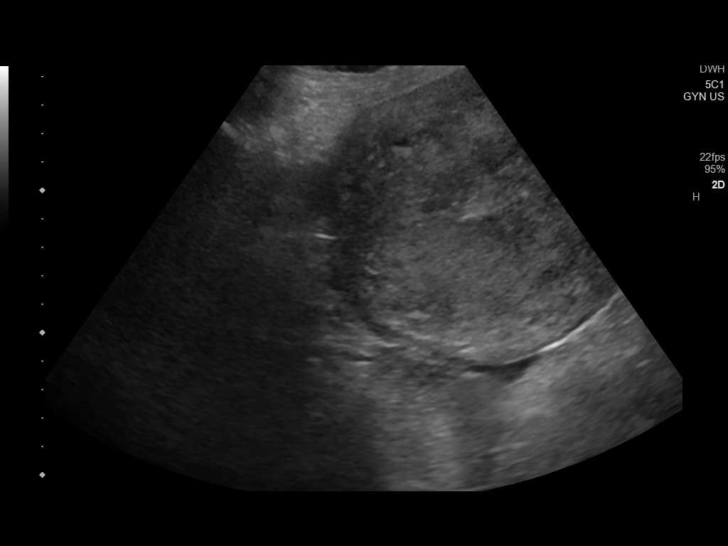
[im 46/62]
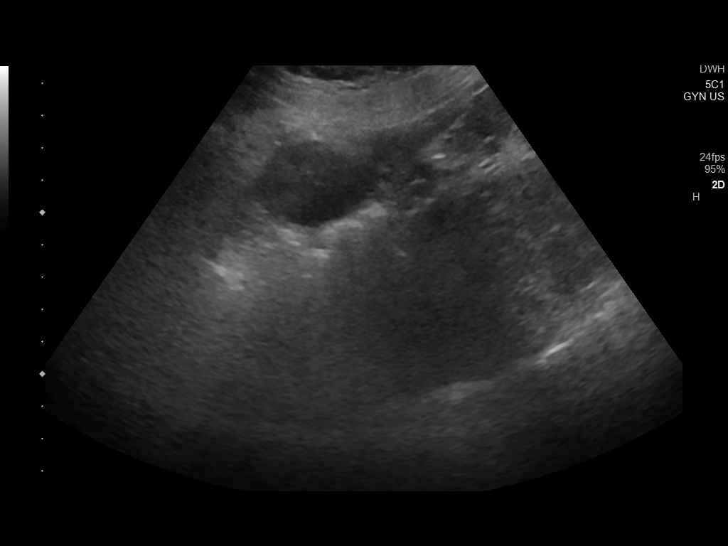
[im 51/62]
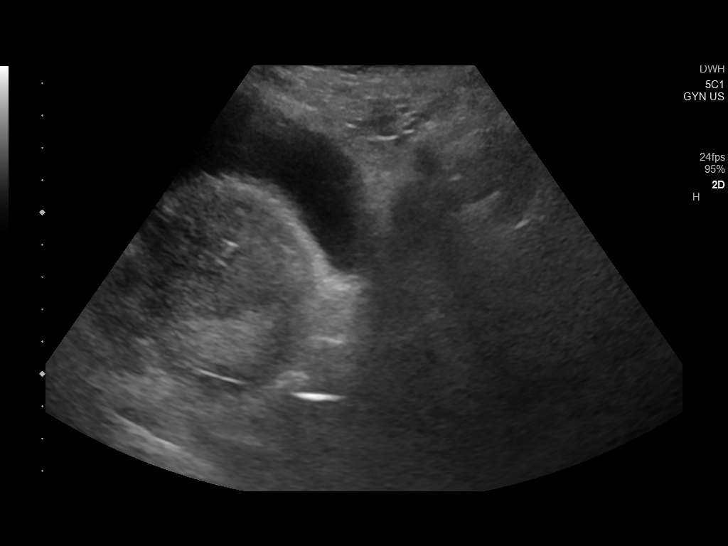
[im 56/62]
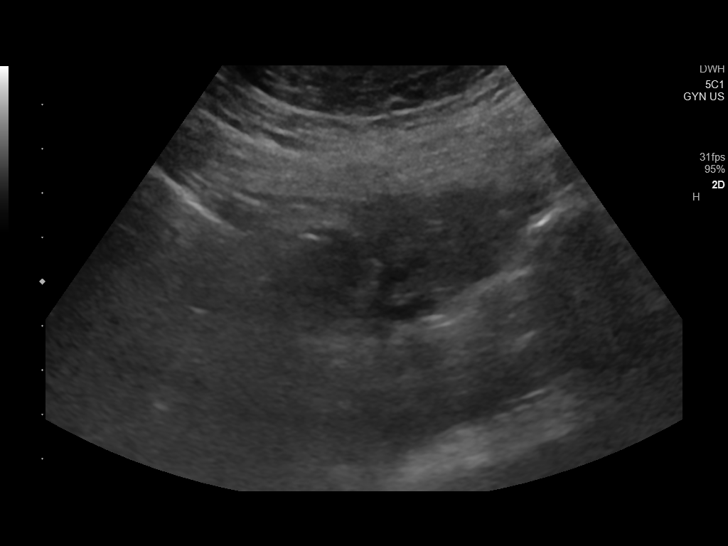
[im 62/62]
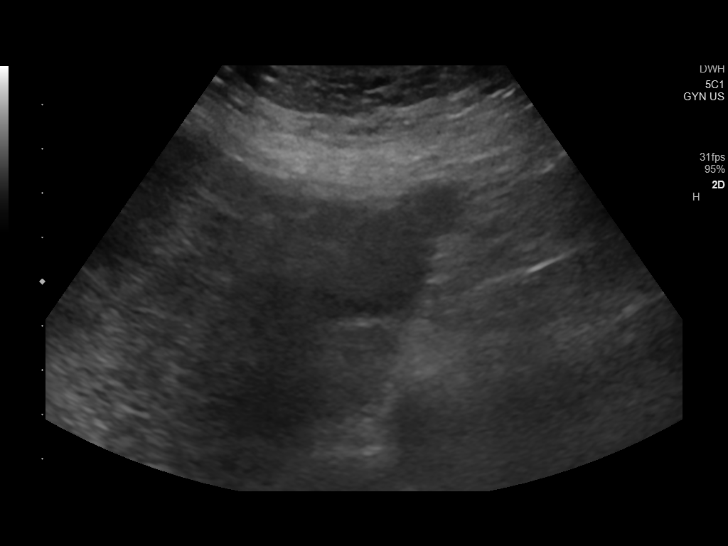

[13 of 25 positions shown; findings below may reference images not displayed]

FINDINGS: Uterus

Measurements: 17.2 x 10.8 x 10.5 cm = volume: 1,018 mL. Enlarged
postpartum uterus. No fibroids or other mass visualized.

Endometrium

Thickness: 21 mm. Thickened with heterogeneous appearance and
associated vascularity.

Right ovary

Measurements: 4 x 2.7 x 2.4 cm = volume: 13.1 mL. Normal
appearance/no adnexal mass.

Left ovary

Measurements: 3.5 x 1.5 x 2.6 cm = volume: 6.8 mL. Normal
appearance/no adnexal mass.

Other findings:  No abnormal free fluid.
IMPRESSION: 1. Thickened, heterogeneous, and vascular endometrium suggestive of
retained products of conception.
2. Bilateral ovaries are unremarkable.

These results will be called to the ordering clinician or
representative by the Radiologist Assistant, and communication
documented in the PACS or [REDACTED].

## 2021-11-08 ENCOUNTER — Telehealth: Payer: Self-pay

## 2021-11-08 NOTE — Telephone Encounter (Signed)
Contacted patient to triage how long she has had symptoms for and LMP.Patients last office visit was 03/02/21. Left message for patient to contact office back, will advise that an office visit is needed to address and discuss options and treatment. KW

## 2021-11-08 NOTE — Telephone Encounter (Signed)
Patient had contacted office requesting for U/S for irregular menstrual cycle and severe cramping.KW

## 2021-11-08 NOTE — Telephone Encounter (Signed)
Patient has reached back out to office and was advised that office visit is need to addresss. KW

## 2021-11-19 ENCOUNTER — Ambulatory Visit: Payer: Medicaid Other | Admitting: Obstetrics & Gynecology

## 2021-11-26 ENCOUNTER — Ambulatory Visit (LOCAL_COMMUNITY_HEALTH_CENTER): Payer: Self-pay

## 2021-11-26 DIAGNOSIS — Z111 Encounter for screening for respiratory tuberculosis: Secondary | ICD-10-CM

## 2021-11-26 DIAGNOSIS — Z7185 Encounter for immunization safety counseling: Secondary | ICD-10-CM

## 2021-11-26 NOTE — Progress Notes (Signed)
In nurse clinic requesting vaccines: Tdap and varicella. Also wants PPD. 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   PPD placed today.   Per Epic, Pt had Tdap 2022 and 2019 and therefore Tdap not indicated today. [facility] updated.   Patient states hx of chicken pox. Per Epic, pt had varicella titer 05/28/2017 while pregnant showing immunity. No varicella vaccine indicated.   [facility] updated and pt plans to pick up immunization/[facility] copy 11/29/2021 when has PPDR. Jerel Shepherd, RN

## 2021-11-27 ENCOUNTER — Ambulatory Visit: Payer: Medicaid Other | Admitting: Obstetrics & Gynecology

## 2021-11-29 ENCOUNTER — Ambulatory Visit (LOCAL_COMMUNITY_HEALTH_CENTER): Payer: Medicaid Other

## 2021-11-29 DIAGNOSIS — Z111 Encounter for screening for respiratory tuberculosis: Secondary | ICD-10-CM

## 2021-11-29 LAB — TB SKIN TEST
Induration: 0 mm
TB Skin Test: NEGATIVE

## 2021-11-29 NOTE — Progress Notes (Signed)
In nurse clinic for PPDR (0 mm /Negative). Also updated [facility] copy given to pt. Jerel Shepherd, RN

## 2021-12-14 ENCOUNTER — Other Ambulatory Visit: Payer: Medicaid Other

## 2021-12-31 ENCOUNTER — Ambulatory Visit (LOCAL_COMMUNITY_HEALTH_CENTER): Payer: Medicaid Other

## 2021-12-31 DIAGNOSIS — Z111 Encounter for screening for respiratory tuberculosis: Secondary | ICD-10-CM

## 2022-01-03 ENCOUNTER — Other Ambulatory Visit: Payer: Medicaid Other

## 2022-01-03 ENCOUNTER — Ambulatory Visit (LOCAL_COMMUNITY_HEALTH_CENTER): Payer: Medicaid Other

## 2022-01-03 DIAGNOSIS — Z111 Encounter for screening for respiratory tuberculosis: Secondary | ICD-10-CM

## 2022-01-03 LAB — TB SKIN TEST
Induration: 0 mm
TB Skin Test: NEGATIVE

## 2022-05-09 ENCOUNTER — Other Ambulatory Visit (HOSPITAL_COMMUNITY)
Admission: RE | Admit: 2022-05-09 | Discharge: 2022-05-09 | Disposition: A | Discharge status: Home / Self Care | Payer: Medicaid Other | Source: Ambulatory Visit | Attending: Obstetrics and Gynecology | Admitting: Obstetrics and Gynecology

## 2022-05-09 ENCOUNTER — Telehealth: Payer: Self-pay

## 2022-05-09 ENCOUNTER — Ambulatory Visit (INDEPENDENT_AMBULATORY_CARE_PROVIDER_SITE_OTHER): Payer: Medicaid Other

## 2022-05-09 VITALS — BP 138/80 | Ht 66.0 in | Wt 247.0 lb

## 2022-05-09 DIAGNOSIS — Z130 Encounter for screening for diseases of the blood and blood-forming organs and certain disorders involving the immune mechanism: Secondary | ICD-10-CM

## 2022-05-09 DIAGNOSIS — R350 Frequency of micturition: Secondary | ICD-10-CM | POA: Diagnosis not present

## 2022-05-09 DIAGNOSIS — Z113 Encounter for screening for infections with a predominantly sexual mode of transmission: Secondary | ICD-10-CM

## 2022-05-09 LAB — POCT URINALYSIS DIPSTICK
Bilirubin, UA: NEGATIVE
Blood, UA: NEGATIVE
Glucose, UA: NEGATIVE
Ketones, UA: NEGATIVE
Leukocytes, UA: NEGATIVE
Nitrite, UA: NEGATIVE
Protein, UA: NEGATIVE
Spec Grav, UA: <=1.005 — AB (ref 1.010–1.025)
Urobilinogen, UA: 0.2 E.U./dL
pH, UA: 5 (ref 5.0–8.0)

## 2022-05-09 NOTE — Telephone Encounter (Signed)
Lab order placed. Can also start MVI with iron daily with food.

## 2022-05-09 NOTE — Progress Notes (Signed)
    NURSE VISIT NOTE  Subjective:    Patient ID: Tracey Christensen, female    DOB: 10-Sep-1990, 32 y.o.   MRN: [id number]  HPI  Patient is a 32 y.o. [id number] female who presents for vaginal discomfort and urinary frequency. [facility] and urine culture was sent. She got a call back that urine was negative for infection and that she should continue to take antibiotic. She has noticed migraines since she started the antibiotic. She would like to be tested for BV and Yeast along with STD's. Denies abnormal vaginal bleeding and/or severe pelvic pain. Denies burning urinating and blood in urine or when she wipes. Patient does not history of known exposure to STD. Per Paula Compton, CNM, patient should stop antibiotic since culture was negative for UTI and no urine culture to be sent.   Objective:    BP 138/80   Ht  (1.676 m)   Wt 247 lb (112 kg)   LMP 05/03/2022 (Exact Date)   BMI 39.87 kg/m     Assessment:   1. Screening for STD (sexually transmitted disease)   2. Urinary frequency      Plan:   GC and chlamydia, BV and Yeast probe sent to lab. Treatment: Will wait for results to be treated if needed. ROV prn if symptoms persist or worsen.   Donnetta Hail, CMA

## 2022-05-09 NOTE — Telephone Encounter (Signed)
Pt came in office for self swab nurse visit, during the visit she asked if she could have her iron level checked. For the last couple of months, she has been noticing that she stays cold all the time. Her iron level was low about two years ago and she feels like that. She does not have a PCP and she wanted to know if an order for a lab appt could be put in or should she wait for her annual visit on 06/25/22?

## 2022-05-13 ENCOUNTER — Ambulatory Visit: Payer: Medicaid Other

## 2022-05-13 ENCOUNTER — Encounter: Payer: Self-pay | Admitting: Obstetrics and Gynecology

## 2022-05-13 LAB — CERVICOVAGINAL ANCILLARY ONLY
Bacterial Vaginitis (gardnerella): NEGATIVE
Candida Glabrata: NEGATIVE
Candida Vaginitis: NEGATIVE
Chlamydia: NEGATIVE
Comment: NEGATIVE
Comment: NEGATIVE
Comment: NEGATIVE
Comment: NEGATIVE
Comment: NEGATIVE
Comment: NORMAL
Neisseria Gonorrhea: NEGATIVE
Trichomonas: NEGATIVE

## 2022-05-13 NOTE — Telephone Encounter (Signed)
Pt aware. Pls call pt to schedule lab appt. 

## 2022-05-14 NOTE — Progress Notes (Unsigned)
    Patient, No Pcp Per   No chief complaint on file.   HPI:      [name] is a 32 y.o. [name] whose LMP was Patient's last menstrual period was 05/03/2022 (exact date)., presents today for ***    Patient Active Problem List   Diagnosis Date Noted   Postpartum hemorrhage 03/29/2020   Unwanted fertility    Labor and delivery indication for care or intervention 02/04/2020   Obesity affecting pregnancy 09/27/2019   Morbid obesity (HCC) 232 lbs 08/19/2019   History of pre-eclampsia in prior pregnancy, currently pregnant 05/28/2017    Past Surgical History:  Procedure Laterality Date   DILATION AND EVACUATION N/A 03/29/2020   Procedure: DILATATION AND EVACUATION;  Surgeon: Nadara Mustard, MD;  Location: [facility];  Service: Gynecology;  Laterality: N/A;   HAND SURGERY Right    TUBAL LIGATION Bilateral 03/24/2020   Procedure: POST PARTUM TUBAL LIGATION;  Surgeon: Vena Austria, MD;  Location: [facility];  Service: Gynecology;  Laterality: Bilateral;    Family History  Problem Relation Age of Onset   Diabetes Maternal Grandmother    Hypertension Paternal Grandmother    Diabetes Paternal Grandfather     Social History   Socioeconomic History   Marital status: Single    Spouse name: Not on file   Number of children: Not on file   Years of education: Not on file   Highest education level: Not on file  Occupational History   Not on file  Tobacco Use   Smoking status: Never   Smokeless tobacco: Never  Vaping Use   Vaping Use: Never used  Substance and Sexual Activity   Alcohol use: Not Currently   Drug use: No   Sexual activity: Yes    Partners: Male    Birth control/protection: Surgical    Comment: Tubal Ligation  Other Topics Concern   Not on file  Social History Narrative   Not on file   Social Determinants of Health   Financial Resource Strain: Not on file  Food Insecurity: Not on file  Transportation Needs: Not on file  Physical Activity:  Not on file  Stress: Not on file  Social Connections: Not on file  Intimate Partner Violence: Not At Risk (07/21/2019)   Humiliation, Afraid, Rape, and Kick questionnaire    Fear of Current or Ex-Partner: No    Emotionally Abused: No    Physically Abused: No    Sexually Abused: No    Outpatient Medications Prior to Visit  Medication Sig Dispense Refill   nitrofurantoin, macrocrystal-monohydrate, (MACROBID) 100 MG capsule Take 100 mg by mouth every 12 (twelve) hours.     No facility-administered medications prior to visit.      ROS:  Review of Systems BREAST: No symptoms   OBJECTIVE:   Vitals:  LMP 05/03/2022 (Exact Date)   Physical Exam  Results: No results found for this or any previous visit (from the past 24 hour(s)).   Assessment/Plan: No diagnosis found.    No orders of the defined types were placed in this encounter.     No follow-ups on file.  Kalijah Westfall B. Pattie Flaharty, PA-C 05/14/2022 5:09 PM

## 2022-05-14 NOTE — Telephone Encounter (Signed)
The patient is scheduled for 5/3 for labs

## 2022-05-16 ENCOUNTER — Other Ambulatory Visit: Payer: Medicaid Other

## 2022-05-16 ENCOUNTER — Encounter: Payer: Self-pay | Admitting: Obstetrics and Gynecology

## 2022-05-16 ENCOUNTER — Ambulatory Visit (INDEPENDENT_AMBULATORY_CARE_PROVIDER_SITE_OTHER): Payer: Medicaid Other | Admitting: Obstetrics and Gynecology

## 2022-05-16 VITALS — BP 120/80 | Ht 66.0 in | Wt 248.0 lb

## 2022-05-16 DIAGNOSIS — N9089 Other specified noninflammatory disorders of vulva and perineum: Secondary | ICD-10-CM | POA: Diagnosis not present

## 2022-05-16 DIAGNOSIS — Z130 Encounter for screening for diseases of the blood and blood-forming organs and certain disorders involving the immune mechanism: Secondary | ICD-10-CM

## 2022-05-16 MED ORDER — CLOTRIMAZOLE-BETAMETHASONE 1-0.05 % EX CREA: TOPICAL_CREAM | CUTANEOUS | 0 refills | Status: DC

## 2022-05-16 NOTE — Patient Instructions (Signed)
I value your feedback and you entrusting us with your care. If you get a [facility] patient survey, I would appreciate you taking the time to let us know about your experience today. Thank you! ? ? ?

## 2022-05-17 ENCOUNTER — Other Ambulatory Visit: Payer: Medicaid Other

## 2022-05-23 ENCOUNTER — Ambulatory Visit: Payer: Medicaid Other

## 2022-06-25 ENCOUNTER — Ambulatory Visit: Payer: Medicaid Other | Admitting: Obstetrics and Gynecology

## 2022-06-25 NOTE — Progress Notes (Deleted)
New patient visit  Patient: Tracey Christensen   DOB: March 12, 1990   31 y.o. Female  MRN: [id number] Visit Date: 06/26/2022  Today's healthcare provider: Debera Lat, PA-C   No chief complaint on file.  Subjective    Tracey Christensen is a 32 y.o. female who presents today as a new patient to establish care.  HPI  ***  Past Medical History:  Diagnosis Date   Gestational hypertension    Past Surgical History:  Procedure Laterality Date   DILATION AND EVACUATION N/A 03/29/2020   Procedure: DILATATION AND EVACUATION;  Surgeon: Nadara Mustard, MD;  Location: [facility];  Service: Gynecology;  Laterality: N/A;   HAND SURGERY Right    TUBAL LIGATION Bilateral 03/24/2020   Procedure: POST PARTUM TUBAL LIGATION;  Surgeon: Vena Austria, MD;  Location: [facility];  Service: Gynecology;  Laterality: Bilateral;   Family Status  Relation Name Status   MGM  (Not Specified)   PGM  (Not Specified)   PGF  (Not Specified)   Mother  Alive   Father  Alive   Family History  Problem Relation Age of Onset   Diabetes Maternal Grandmother    Hypertension Paternal Grandmother    Diabetes Paternal Grandfather    Social History   Socioeconomic History   Marital status: Single    Spouse name: Not on file   Number of children: Not on file   Years of education: Not on file   Highest education level: Not on file  Occupational History   Not on file  Tobacco Use   Smoking status: Never   Smokeless tobacco: Never  Vaping Use   Vaping Use: Never used  Substance and Sexual Activity   Alcohol use: Not Currently   Drug use: No   Sexual activity: Yes    Partners: Male    Birth control/protection: Surgical    Comment: Tubal Ligation  Other Topics Concern   Not on file  Social History Narrative   Not on file   Social Determinants of Health   Financial Resource Strain: Not on file  Food Insecurity: Not on file  Transportation Needs: Not on file  Physical Activity: Not on file  Stress: Not on  file  Social Connections: Not on file   Outpatient Medications Prior to Visit  Medication Sig   clotrimazole-betamethasone (LOTRISONE) cream Apply externally BID prn sx up to 2 wks   No facility-administered medications prior to visit.   No Known Allergies  Immunization History  Administered Date(s) Administered   Hepatitis B 10/02/2001, 11/17/2001, 04/06/2002   MMR 03/01/1991, 12/31/1993   Meningococcal Conjugate 08/22/2008   PPD Test 11/26/2021, 12/31/2021   Tdap 10/24/2017, 01/17/2020    Health Maintenance  Topic Date Due   COVID-19 Vaccine (1) Never done   Hepatitis C Screening  Never done   INFLUENZA VACCINE  08/15/2022   PAP SMEAR-Modifier  04/27/2023   DTaP/Tdap/Td (3 - Td or Tdap) 01/16/2030   HIV Screening  Completed   HPV VACCINES  Aged Out    Patient Care Team: Patient, No Pcp Per as PCP - General (General Practice)  Review of Systems  All other systems reviewed and are negative.  Except see HPI   {Labs  Heme  Chem  Endocrine  Serology  Results Review (optional):23779}   Objective    There were no vitals taken for this visit. {Show previous vital signs (optional):23777}  Physical Exam Vitals reviewed.  Constitutional:      General: She is not in  acute distress.    Appearance: Normal appearance. She is well-developed. She is not diaphoretic.  HENT:     Head: Normocephalic and atraumatic.  Eyes:     General: No scleral icterus.    Conjunctiva/sclera: Conjunctivae normal.  Neck:     Thyroid: No thyromegaly.  Cardiovascular:     Rate and Rhythm: Normal rate and regular rhythm.     Pulses: Normal pulses.     Heart sounds: Normal heart sounds. No murmur heard. Pulmonary:     Effort: Pulmonary effort is normal. No respiratory distress.     Breath sounds: Normal breath sounds. No wheezing, rhonchi or rales.  Musculoskeletal:     Cervical back: Neck supple.     Right lower leg: No edema.     Left lower leg: No edema.  Lymphadenopathy:      Cervical: No cervical adenopathy.  Skin:    General: Skin is warm and dry.     Findings: No rash.  Neurological:     Mental Status: She is alert and oriented to person, place, and time. Mental status is at baseline.  Psychiatric:        Mood and Affect: Mood normal.        Behavior: Behavior normal.     Depression Screen    07/21/2019    2:53 PM 06/23/2019    8:43 AM  PHQ 2/9 Scores  PHQ - 2 Score 0 0   No results found for any visits on 06/26/22.  Assessment & Plan     *** Encounter to establish care Welcomed to our clinic Reviewed past medical hx, social hx, family hx and surgical hx Pt advised to send all vaccination records or screening   No follow-ups on file.    The patient was advised to call back or seek an in-person evaluation if the symptoms worsen or if the condition fails to improve as anticipated.  I discussed the assessment and treatment plan with the patient. The patient was provided an opportunity to ask questions and all were answered. The patient agreed with the plan and demonstrated an understanding of the instructions.  I, Debera Lat, PA-C have reviewed all documentation for this visit. The documentation on 06/26/22 for the exam, diagnosis, procedures, and orders are all accurate and complete.  Debera Lat, Physician [facility], MMS [facility] [phone] (phone) [phone] (fax)  [facility]

## 2022-06-26 ENCOUNTER — Ambulatory Visit: Payer: Medicaid Other | Admitting: Physician Assistant

## 2022-06-26 DIAGNOSIS — Z7689 Persons encountering health services in other specified circumstances: Secondary | ICD-10-CM

## 2022-07-15 ENCOUNTER — Encounter (INDEPENDENT_AMBULATORY_CARE_PROVIDER_SITE_OTHER): Payer: Medicaid Other | Admitting: Family Medicine

## 2022-07-24 ENCOUNTER — Ambulatory Visit: Payer: Medicaid Other | Admitting: Physician Assistant

## 2022-08-08 NOTE — Progress Notes (Signed)
This encounter was created in error - please disregard.

## 2022-08-12 ENCOUNTER — Encounter (INDEPENDENT_AMBULATORY_CARE_PROVIDER_SITE_OTHER): Payer: Self-pay

## 2022-08-12 ENCOUNTER — Telehealth (INDEPENDENT_AMBULATORY_CARE_PROVIDER_SITE_OTHER): Payer: Self-pay | Admitting: Family Medicine

## 2022-08-12 ENCOUNTER — Encounter (INDEPENDENT_AMBULATORY_CARE_PROVIDER_SITE_OTHER): Payer: Medicaid Other | Admitting: Family Medicine

## 2022-08-12 DIAGNOSIS — Z6841 Body Mass Index (BMI) 40.0 and over, adult: Secondary | ICD-10-CM

## 2022-08-12 NOTE — Telephone Encounter (Signed)
Patient was a no-show for virtual visit.  Waited on the virtual visit until 6 minutes past appointment time per office policy.  Left VM advising patient to call to reschedule.  

## 2023-05-14 NOTE — Progress Notes (Signed)
 PCP:  Patient, No Pcp Per   Chief Complaint  Patient presents with   Gynecologic Exam    No concerns     HPI:      [name] is a 33 y.o. [id number] whose LMP was Patient's last menstrual period was 05/04/2023 (approximate)., presents today for her annual examination.  Her menses are regular every 28-30 days, lasting 7-8 days, but only 3-4 days real flow, rest is brown d/c.  No BTB, mod to severe dysmen since BTL; barely improved with motrin  and tylenol .   Sex activity: single partner, contraception - tubal ligation. No pain/bleeding/dryness. Last Pap: 04/26/20 Results were: no abnormalities /neg HPV DNA. Would like STD testing today as well as BV testing. Has noticed ammonia  odor after sex that resolves with showering. No sx today.   There is no FH of breast cancer. There is no FH of ovarian cancer. The patient does do self-breast exams.  Tobacco use: The patient denies current or previous tobacco use. Alcohol use: social drinker No drug use.  Exercise: moderately active  She does get adequate calcium  but not Vitamin D in her diet.  Patient Active Problem List   Diagnosis Date Noted   Dysmenorrhea 05/15/2023   Postpartum hemorrhage 03/29/2020   Unwanted fertility    Labor and delivery indication for care or intervention 02/04/2020   Obesity affecting pregnancy 09/27/2019   Morbid obesity (HCC) 232 lbs 08/19/2019   History of pre-eclampsia in prior pregnancy, currently pregnant 05/28/2017    Past Surgical History:  Procedure Laterality Date   DILATION AND EVACUATION N/A 03/29/2020   Procedure: DILATATION AND EVACUATION;  Surgeon: Alben Alma, MD;  Location: [facility];  Service: Gynecology;  Laterality: N/A;   HAND SURGERY Right    TUBAL LIGATION Bilateral 03/24/2020   Procedure: POST PARTUM TUBAL LIGATION;  Surgeon: Darl Edu, MD;  Location: [facility];  Service: Gynecology;  Laterality: Bilateral;    Family History  Problem Relation Age of Onset    Diabetes Maternal Grandmother    Hypertension Paternal Grandmother    Diabetes Paternal Grandfather     Social History   Socioeconomic History   Marital status: Single    Spouse name: Not on file   Number of children: Not on file   Years of education: Not on file   Highest education level: Not on file  Occupational History   Not on file  Tobacco Use   Smoking status: Never   Smokeless tobacco: Never  Vaping Use   Vaping status: Never Used  Substance and Sexual Activity   Alcohol use: Yes    Comment: soc   Drug use: No   Sexual activity: Yes    Partners: Male    Birth control/protection: Surgical    Comment: Tubal Ligation  Other Topics Concern   Not on file  Social History Narrative   Not on file   Social Drivers of Health   Financial Resource Strain: Not on file  Food Insecurity: Not on file  Transportation Needs: Not on file  Physical Activity: Not on file  Stress: Not on file  Social Connections: Not on file  Intimate Partner Violence: Not At Risk (07/21/2019)   Humiliation, Afraid, Rape, and Kick questionnaire    Fear of Current or Ex-Partner: No    Emotionally Abused: No    Physically Abused: No    Sexually Abused: No     Current Outpatient Medications:    ibuprofen  (ADVIL ) 800 MG tablet, Take 1 tablet (  800 mg total) by mouth every 8 (eight) hours as needed., Disp: 30 tablet, Rfl: 1     ROS:  Review of Systems  Constitutional:  Negative for fatigue, fever and unexpected weight change.  Respiratory:  Negative for cough, shortness of breath and wheezing.   Cardiovascular:  Negative for chest pain, palpitations and leg swelling.  Gastrointestinal:  Negative for blood in stool, constipation, diarrhea, nausea and vomiting.  Endocrine: Negative for cold intolerance, heat intolerance and polyuria.  Genitourinary:  Negative for dyspareunia, dysuria, flank pain, frequency, genital sores, hematuria, menstrual problem, pelvic pain, urgency, vaginal bleeding,  vaginal discharge and vaginal pain.  Musculoskeletal:  Negative for back pain, joint swelling and myalgias.  Skin:  Negative for rash.  Neurological:  Negative for dizziness, syncope, light-headedness, numbness and headaches.  Hematological:  Negative for adenopathy.  Psychiatric/Behavioral:  Negative for agitation, confusion, sleep disturbance and suicidal ideas. The patient is not nervous/anxious.    BREAST: No symptoms   Objective: BP 116/78   Pulse 73   Ht 5\' 6"  (1.676 m)   Wt 244 lb (110.7 kg)   LMP 05/04/2023 (Approximate)   BMI 39.38 kg/m    Physical Exam Constitutional:      Appearance: She is well-developed.  Genitourinary:     Vulva normal.     Right Labia: No rash, tenderness or lesions.    Left Labia: No tenderness, lesions or rash.    No vaginal discharge, erythema or tenderness.      Right Adnexa: not tender and no mass present.    Left Adnexa: not tender and no mass present.    No cervical friability or polyp.     Uterus is not enlarged or tender.  Breasts:    Right: No mass, nipple discharge, skin change or tenderness.     Left: No mass, nipple discharge, skin change or tenderness.  Neck:     Thyroid: No thyromegaly.  Cardiovascular:     Rate and Rhythm: Normal rate and regular rhythm.     Heart sounds: Normal heart sounds. No murmur heard. Pulmonary:     Effort: Pulmonary effort is normal.     Breath sounds: Normal breath sounds.  Abdominal:     Palpations: Abdomen is soft.     Tenderness: There is no abdominal tenderness. There is no guarding or rebound.  Musculoskeletal:        General: Normal range of motion.     Cervical back: Normal range of motion.  Lymphadenopathy:     Cervical: No cervical adenopathy.  Neurological:     General: No focal deficit present.     Mental Status: She is alert and oriented to person, place, and time.     Cranial Nerves: No cranial nerve deficit.  Skin:    General: Skin is warm and dry.  Psychiatric:         Mood and Affect: Mood normal.        Behavior: Behavior normal.        Thought Content: Thought content normal.        Judgment: Judgment normal.  Vitals reviewed.    Assessment/Plan: Encounter for annual routine gynecological examination  Cervical cancer screening - Plan: Cytology - PAP  Screening for STD (sexually transmitted disease) - Plan: NuSwab Vaginitis Plus (VG+)  Screening for HPV (human papillomavirus) - Plan: Cytology - PAP  Vaginal odor - Plan: NuSwab Vaginitis Plus (VG+); check nuswab. If neg, reassurance. Try repHresh after sex prn.   Dysmenorrhea - Plan: ibuprofen  (  ADVIL ) 800 MG tablet; Rx ibup with tylenol  if needed. F/u prn.   Meds ordered this encounter  Medications   ibuprofen  (ADVIL ) 800 MG tablet    Sig: Take 1 tablet (800 mg total) by mouth every 8 (eight) hours as needed.    Dispense:  30 tablet    Refill:  1    Supervising Provider:   ROBY, MICIA [[id number]]             GYN counsel adequate intake of calcium  and vitamin D, diet and exercise     F/U  Return in about 1 year (around 05/14/2024).  Aliyanna Wassmer B. Abdi Husak, PA-C 05/15/2023 11:21 AM

## 2023-05-15 ENCOUNTER — Encounter: Payer: Self-pay | Admitting: Obstetrics and Gynecology

## 2023-05-15 ENCOUNTER — Other Ambulatory Visit (HOSPITAL_COMMUNITY)
Admission: RE | Admit: 2023-05-15 | Discharge: 2023-05-15 | Disposition: A | Discharge status: Home / Self Care | Source: Ambulatory Visit | Attending: Obstetrics and Gynecology | Admitting: Obstetrics and Gynecology

## 2023-05-15 ENCOUNTER — Ambulatory Visit (INDEPENDENT_AMBULATORY_CARE_PROVIDER_SITE_OTHER): Admitting: Obstetrics and Gynecology

## 2023-05-15 VITALS — BP 116/78 | HR 73 | Ht 66.0 in | Wt 244.0 lb

## 2023-05-15 DIAGNOSIS — Z1151 Encounter for screening for human papillomavirus (HPV): Secondary | ICD-10-CM | POA: Diagnosis present

## 2023-05-15 DIAGNOSIS — N946 Dysmenorrhea, unspecified: Secondary | ICD-10-CM | POA: Insufficient documentation

## 2023-05-15 DIAGNOSIS — Z113 Encounter for screening for infections with a predominantly sexual mode of transmission: Secondary | ICD-10-CM

## 2023-05-15 DIAGNOSIS — Z01419 Encounter for gynecological examination (general) (routine) without abnormal findings: Secondary | ICD-10-CM

## 2023-05-15 DIAGNOSIS — Z124 Encounter for screening for malignant neoplasm of cervix: Secondary | ICD-10-CM

## 2023-05-15 DIAGNOSIS — N898 Other specified noninflammatory disorders of vagina: Secondary | ICD-10-CM

## 2023-05-15 MED ORDER — IBUPROFEN 800 MG PO TABS: 800.0000 mg | ORAL_TABLET | Freq: Three times a day (TID) | ORAL | 1 refills | Status: DC | PRN

## 2023-05-15 NOTE — Patient Instructions (Signed)
 I value your feedback and you entrusting [place] with your care. If you get a [facility] patient survey, I would appreciate you taking the time to let us know about your experience today. Thank you! ? ? ?

## 2023-05-18 ENCOUNTER — Encounter: Payer: Self-pay | Admitting: Obstetrics and Gynecology

## 2023-05-18 LAB — NUSWAB VAGINITIS PLUS (VG+)
Candida albicans, NAA: NEGATIVE
Candida glabrata, NAA: NEGATIVE
Chlamydia trachomatis, NAA: NEGATIVE
Neisseria gonorrhoeae, NAA: NEGATIVE
Trich vag by NAA: NEGATIVE

## 2023-05-19 LAB — CYTOLOGY - PAP
Comment: NEGATIVE
Diagnosis: NEGATIVE
High risk HPV: NEGATIVE

## 2023-07-29 ENCOUNTER — Other Ambulatory Visit: Payer: Self-pay

## 2023-07-29 DIAGNOSIS — N946 Dysmenorrhea, unspecified: Secondary | ICD-10-CM

## 2023-07-29 MED ORDER — IBUPROFEN 800 MG PO TABS: 800.0000 mg | ORAL_TABLET | Freq: Three times a day (TID) | ORAL | 1 refills | Status: DC | PRN

## 2023-07-29 NOTE — Telephone Encounter (Signed)
 Pls contact pt to see how many she is taking. Has 2 months worth of Rx so shouldn't be out yet. Thx.

## 2023-10-13 ENCOUNTER — Ambulatory Visit

## 2023-10-13 VITALS — BP 117/88 | HR 65 | Ht 66.0 in | Wt 243.7 lb

## 2023-10-13 DIAGNOSIS — Z136 Encounter for screening for cardiovascular disorders: Secondary | ICD-10-CM

## 2023-10-13 DIAGNOSIS — R635 Abnormal weight gain: Secondary | ICD-10-CM | POA: Insufficient documentation

## 2023-10-13 DIAGNOSIS — Z131 Encounter for screening for diabetes mellitus: Secondary | ICD-10-CM

## 2023-10-13 DIAGNOSIS — Z6839 Body mass index (BMI) 39.0-39.9, adult: Secondary | ICD-10-CM | POA: Insufficient documentation

## 2023-10-13 DIAGNOSIS — N946 Dysmenorrhea, unspecified: Secondary | ICD-10-CM

## 2023-10-13 NOTE — Progress Notes (Signed)
 New patient visit  Patient: Tracey Christensen   DOB: 15-Nov-1990   33 y.o. Female  MRN: [id number] Visit Date: 10/13/2023  Today's healthcare provider: Isaiah DELENA Pepper, MD   Chief Complaint  Patient presents with   New Patient (Initial Visit)   Weight Loss    Patient reports she walks two to three miles a day. She is not apart of any weight loss programs. Family hx of diabetes.   Subjective    Tracey Christensen is a 33 y.o. female who presents today as a new patient to establish care.   Weight: - Walks 3 miles daily - Only drinks water and coffee - Eats salad for lunch - Dinners include protein, vegetable, and carbohydrate - Interested in starting medication for weight loss  - Hx of tubal ligation in 2022 - Hx of pre-E in pregnancy but no hx of HTN  Past Medical History:  Diagnosis Date   Blood transfusion without reported diagnosis    After birth   Gestational hypertension    Past Surgical History:  Procedure Laterality Date   DILATION AND EVACUATION N/A 03/29/2020   Procedure: DILATATION AND EVACUATION;  Surgeon: Arloa Lamar SQUIBB, MD;  Location: [facility];  Service: Gynecology;  Laterality: N/A;   HAND SURGERY Right    TUBAL LIGATION Bilateral 03/24/2020   Procedure: POST PARTUM TUBAL LIGATION;  Surgeon: Lake Read, MD;  Location: [facility];  Service: Gynecology;  Laterality: Bilateral;   Family Status  Relation Name Status   MGM  (Not Specified)   PGM [place] (Not Specified)   PGF  (Not Specified)   Mother  Alive   Father  Alive  No partnership data on file   Family History  Problem Relation Age of Onset   Diabetes Maternal Grandmother    Hypertension Paternal Grandmother    Diabetes Paternal Grandfather    Social History   Socioeconomic History   Marital status: Single    Spouse name: Not on file   Number of children: Not on file   Years of education: Not on file   Highest education level: Some college, no degree  Occupational History   Not on  file  Tobacco Use   Smoking status: Never   Smokeless tobacco: Never  Vaping Use   Vaping status: Never Used  Substance and Sexual Activity   Alcohol use: Yes    Comment: soc   Drug use: No   Sexual activity: Yes    Partners: Male    Birth control/protection: Surgical    Comment: Tubal Ligation  Other Topics Concern   Not on file  Social History Narrative   Not on file   Social Drivers of Health   Financial Resource Strain: Medium Risk (10/12/2023)   Overall Financial Resource Strain (CARDIA)    Difficulty of Paying Living Expenses: Somewhat hard  Food Insecurity: Patient Declined (10/12/2023)   Hunger Vital Sign    Worried About Running Out of Food in the Last Year: Patient declined    Ran Out of Food in the Last Year: Patient declined  Transportation Needs: No Transportation Needs (10/12/2023)   PRAPARE - Administrator, Civil Service (Medical): No    Lack of Transportation (Non-Medical): No  Physical Activity: Sufficiently Active (10/12/2023)   Exercise Vital Sign    Days of Exercise per Week: 7 days    Minutes of Exercise per Session: 60 min  Stress: No Stress Concern Present (10/12/2023)   [employer] of Occupational Health -  Occupational Stress Questionnaire    Feeling of Stress: Not at all  Social Connections: Moderately Isolated (10/12/2023)   Social Connection and Isolation Panel    Frequency of Communication with Friends and Family: More than three times a week    Frequency of Social Gatherings with Friends and Family: More than three times a week    Attends Religious Services: 1 to 4 times per year    Active Member of [employer] or Organizations: No    Attends Engineer, structural: Not on file    Marital Status: Never married   Outpatient Medications Prior to Visit  Medication Sig   ibuprofen  (ADVIL ) 800 MG tablet Take 1 tablet (800 mg total) by mouth every 8 (eight) hours as needed.   No facility-administered medications prior to visit.    No Known Allergies  Reviews of Systems as noted in HPI.       Objective    BP 117/88 (BP Location: Left Arm, Patient Position: Sitting, Cuff Size: Large)   Pulse 65   Ht 5' 6 (1.676 m)   Wt 243 lb 11.2 oz (110.5 kg)   SpO2 100%   BMI 39.33 kg/m      Physical Exam Constitutional:      Appearance: Normal appearance.  HENT:     Head: Normocephalic and atraumatic.     Mouth/Throat:     Mouth: Mucous membranes are moist.  Eyes:     Pupils: Pupils are equal, round, and reactive to light.  Pulmonary:     Effort: Pulmonary effort is normal.  Skin:    General: Skin is warm.  Neurological:     General: No focal deficit present.     Mental Status: She is alert.     Wt Readings from Last 3 Encounters:  10/13/23 243 lb 11.2 oz (110.5 kg)  05/15/23 244 lb (110.7 kg)  05/16/22 248 lb (112.5 kg)    Depression Screen    10/13/2023    1:31 PM 07/21/2019    2:53 PM 06/23/2019    8:43 AM  PHQ 2/9 Scores  PHQ - 2 Score 0 0 0   No results found for any visits on 10/13/23.  Assessment & Plan      Problem List Items Addressed This Visit       Genitourinary   Dysmenorrhea   Chronic, controlled. Follows with gynecology. Takes ibuprofen +tylenol  during her menstrual cycle which helps. Will continue ibuprofen +tylenol  PRN.        Other   BMI 39.0-39.9,adult   Weight gain - Primary   Patient with difficulty losing weight since having children. Walks 10k steps per day, eats a balanced diet without significant weight loss. Discussed medication options for weight loss, incl GLP1s/GIP, phentermine, contrave. Discussed benefits and possible side effects. Discussed possible insurance coverage/cost. - Will obtain labs as below - Will consider prescribing phentermine/topiramate at next visit in 1 month  Wt Readings from Last 3 Encounters:  10/13/23 243 lb 11.2 oz (110.5 kg)  05/15/23 244 lb (110.7 kg)  05/16/22 248 lb (112.5 kg)         Relevant Orders   Comprehensive  metabolic panel with GFR   TSH   Other Visit Diagnoses       Screening for diabetes mellitus (DM)       Relevant Orders   Hemoglobin A1c     Screening for cardiovascular condition       Relevant Orders   Lipid panel  Return in about 4 weeks (around 11/10/2023) for Follow Up.      Isaiah DELENA Pepper, MD  [facility] [phone] (phone) [phone] (fax)

## 2023-10-13 NOTE — Assessment & Plan Note (Addendum)
 Chronic, controlled. Follows with gynecology. Takes ibuprofen +tylenol  during her menstrual cycle which helps. Will continue ibuprofen +tylenol  PRN.

## 2023-10-13 NOTE — Assessment & Plan Note (Signed)
 Patient with difficulty losing weight since having children. Walks 10k steps per day, eats a balanced diet without significant weight loss. Discussed medication options for weight loss, incl GLP1s/GIP, phentermine, contrave. Discussed benefits and possible side effects. Discussed possible insurance coverage/cost. - Will obtain labs as below - Will consider prescribing phentermine/topiramate at next visit in 1 month  Wt Readings from Last 3 Encounters:  10/13/23 243 lb 11.2 oz (110.5 kg)  05/15/23 244 lb (110.7 kg)  05/16/22 248 lb (112.5 kg)

## 2023-10-14 ENCOUNTER — Ambulatory Visit: Payer: Self-pay

## 2023-10-14 ENCOUNTER — Telehealth: Payer: Self-pay

## 2023-10-14 LAB — COMPREHENSIVE METABOLIC PANEL WITH GFR
ALT: 13 IU/L (ref 0–32)
AST: 14 IU/L (ref 0–40)
Albumin: 4.2 g/dL (ref 3.9–4.9)
Alkaline Phosphatase: 67 IU/L (ref 41–116)
BUN/Creatinine Ratio: 14 (ref 9–23)
BUN: 10 mg/dL (ref 6–20)
Bilirubin Total: <0.2 mg/dL (ref 0.0–1.2)
CO2: 21 mmol/L (ref 20–29)
Calcium: 9.1 mg/dL (ref 8.7–10.2)
Chloride: 104 mmol/L (ref 96–106)
Creatinine, Ser: 0.73 mg/dL (ref 0.57–1.00)
Globulin, Total: 3 g/dL (ref 1.5–4.5)
Glucose: 86 mg/dL (ref 70–99)
Potassium: 4.6 mmol/L (ref 3.5–5.2)
Sodium: 139 mmol/L (ref 134–144)
Total Protein: 7.2 g/dL (ref 6.0–8.5)
eGFR: 111 mL/min/1.73 (ref >59)

## 2023-10-14 LAB — LIPID PANEL
Chol/HDL Ratio: 5.3 ratio — ABNORMAL HIGH (ref 0.0–4.4)
Cholesterol, Total: 184 mg/dL (ref 100–199)
HDL: 35 mg/dL — ABNORMAL LOW (ref >39)
LDL Chol Calc (NIH): 122 mg/dL — ABNORMAL HIGH (ref 0–99)
Triglycerides: 150 mg/dL — ABNORMAL HIGH (ref 0–149)
VLDL Cholesterol Cal: 27 mg/dL (ref 5–40)

## 2023-10-14 LAB — HEMOGLOBIN A1C
Est. average glucose Bld gHb Est-mCnc: 111 mg/dL
Hgb A1c MFr Bld: 5.5 % (ref 4.8–5.6)

## 2023-10-14 LAB — TSH: TSH: 0.805 u[IU]/mL (ref 0.450–4.500)

## 2023-10-14 NOTE — Telephone Encounter (Signed)
 Copied from CRM [id number]. Topic: Clinical - Lab/Test Results >> Oct 14, 2023 10:58 AM Tracey Christensen wrote: Reason for CRM: Pt calling for lab results for 10/13/2023, please call pt at [phone]

## 2023-10-20 ENCOUNTER — Ambulatory Visit

## 2023-10-20 ENCOUNTER — Encounter: Payer: Self-pay | Admitting: Family Medicine

## 2023-10-20 ENCOUNTER — Ambulatory Visit: Admitting: Family Medicine

## 2023-10-20 VITALS — BP 128/69 | HR 63 | Resp 16 | Ht 66.0 in | Wt 240.0 lb

## 2023-10-20 DIAGNOSIS — R519 Headache, unspecified: Secondary | ICD-10-CM | POA: Diagnosis not present

## 2023-10-20 MED ORDER — SUMATRIPTAN SUCCINATE 25 MG PO TABS: 25.0000 mg | ORAL_TABLET | ORAL | 0 refills | Status: AC | PRN

## 2023-10-20 NOTE — Progress Notes (Signed)
 Acute Office Visit  Introduced to nurse practitioner role and practice setting.  All questions answered.  Discussed provider/patient relationship and expectations.   Subjective:     Patient ID: Tracey Christensen, female    DOB: 1990-05-02, 33 y.o.   MRN: [id number]  Chief Complaint  Patient presents with   Acute Visit    Headaches (Rt side) x1wk . Took OTC with no relief    Discussed the use of AI scribe software for clinical note transcription with the patient, who gave verbal consent to proceed.  History of Present Illness Tracey Christensen is a 33 year old female who presents with new onset right-sided headaches.  She has been experiencing sharp, right-sided headaches that began suddenly on Wednesday, October 1st, 2025, localized to the right side of her face with associated photophobia in the right eye. Initially, the pain was rated as 10 out of 10, decreasing with medication but returning when the medication wears off. Currently, she rates the pain as 7 out of 10.  She has tried various over-the-counter medications including dual action Motrin  and Tylenol , Excedrin, and regular Tylenol , without lasting relief. No history of headaches or migraines prior to this episode. No vision changes, dizziness, or nausea, except for dizziness whicd happened with position changes. Dizziness has seemed to subside.  She works from home and has adjusted her computer screen to a darker setting due to light sensitivity. No recent head injuries, scalp tenderness, or fever. She drinks six to seven bottles of water daily and has no history of smoking or seasonal allergies.  No pain with eye movement, facial tenderness, jaw pain, or pain with speaking. No recent illnesses, seizures, loss of consciousness, or tingling sensations. She does not take any blood pressure medications.    HPI  ROS      Objective:    BP 128/69 (BP Location: Right Arm, Patient Position: Sitting, Cuff Size: Normal)   Pulse 63    Resp 16   Ht 5' 6 (1.676 m)   Wt 240 lb (108.9 kg)   SpO2 100%   BMI 38.74 kg/m    Physical Exam Constitutional:      General: She is not in acute distress.    Appearance: Normal appearance. She is obese. She is not ill-appearing, toxic-appearing or diaphoretic.  HENT:     Head: Normocephalic.     Nose: Nose normal.     Mouth/Throat:     Mouth: Mucous membranes are moist.     Pharynx: Oropharynx is clear.  Eyes:     General: No visual field deficit.    Extraocular Movements: Extraocular movements intact.     Pupils: Pupils are equal, round, and reactive to light.  Cardiovascular:     Rate and Rhythm: Normal rate and regular rhythm.     Pulses: Normal pulses.          Radial pulses are 2+ on the right side and 2+ on the left side.       Posterior tibial pulses are 2+ on the right side and 2+ on the left side.     Heart sounds: Normal heart sounds. No murmur heard.    No friction rub. No gallop.     Comments: Temporal pulses bilaterally +2 Pulmonary:     Effort: No respiratory distress.     Breath sounds: No stridor. No wheezing, rhonchi or rales.  Chest:     Chest wall: No tenderness.  Musculoskeletal:     Right lower leg: No  edema.     Left lower leg: No edema.  Skin:    General: Skin is warm and dry.     Capillary Refill: Capillary refill takes less than 2 seconds.  Neurological:     General: No focal deficit present.     Mental Status: She is alert and oriented to person, place, and time. Mental status is at baseline.     GCS: GCS eye subscore is 4. GCS verbal subscore is 5. GCS motor subscore is 6.     Cranial Nerves: No cranial nerve deficit, dysarthria or facial asymmetry.     Sensory: No sensory deficit.     Motor: No weakness, tremor, atrophy, abnormal muscle tone, seizure activity or pronator drift.     Coordination: Coordination is intact. Coordination normal. Finger-Nose-Finger Test and Heel to [facility] Test normal. Rapid alternating movements normal.     Gait:  Gait is intact. Gait normal.  Psychiatric:        Mood and Affect: Mood normal.        Behavior: Behavior normal.        Thought Content: Thought content normal.        Judgment: Judgment normal.     No results found for any visits on 10/20/23.      Assessment & Plan:  Assessment and Plan Assessment & Plan Acute onset right-sided headache, intractable New onset right-sided headache persisting for six days, initially rated as 10/10 in severity, now 7/10. Associated with photophobia in the right eye and dizziness, which seems to have subsided. No history of headaches or migraines. No vision changes, nausea, vomiting, or recent illnesses. Denies fevers Blood pressure slightly elevated today, possibly due to anxiety. Migraine vs temporal arthritis vs neurological cause Suspected migraine, but given new onset, intractable, associated with light sensitivity and dizziness will order urgent CT to rule out any brain/structural changes. Vitals stable.  - Temporal pulses +2, no pain to jaw clenching, eye closing, or to touch.  - No vision changes, EOMs intact - Will order CMP, CBC, CRP, ESR - rule out any electrolyte changes or inflammation  -Temporal arteritis considered due to the nature of the headache. No scalp tenderness or jaw claudication reported. - If inflammation markers positive will order US  with biopsy for temporal arteritis diag - will start po steroids if positive. - Order urgent CT of the head to rule out structural abnormalities. - Advise to continue taking acetaminophen  and ibuprofen  as needed. - will order sumatriptan for migraine abortifacient - Recommend emergency department visit if symptoms worsen.  Problem List Items Addressed This Visit   None Visit Diagnoses       Acute intractable headache, unspecified headache type    -  Primary   Relevant Medications   SUMAtriptan (IMITREX) 25 MG tablet   Other Relevant Orders   CBC with Differential/Platelet   Comprehensive  metabolic panel with GFR   Sed Rate (ESR)   C-reactive protein     Right-sided headache       Relevant Medications   SUMAtriptan (IMITREX) 25 MG tablet   Other Relevant Orders   CBC with Differential/Platelet   Comprehensive metabolic panel with GFR   Sed Rate (ESR)   C-reactive protein       Meds ordered this encounter  Medications   SUMAtriptan (IMITREX) 25 MG tablet    Sig: Take 1 tablet (25 mg total) by mouth every 2 (two) hours as needed for migraine. May repeat in 2 hours if headache persists or recurs.  Dispense:  10 tablet    Refill:  0    Return if symptoms worsen or fail to improve.  Tracey DELENA Boom, FNP  I, Tracey DELENA Boom, FNP, have reviewed all documentation for this visit. The documentation on 10/20/23 for the exam, diagnosis, procedures, and orders are all accurate and complete.

## 2023-10-21 ENCOUNTER — Other Ambulatory Visit: Payer: Self-pay | Admitting: Family Medicine

## 2023-10-21 ENCOUNTER — Ambulatory Visit: Payer: Self-pay | Admitting: Family Medicine

## 2023-10-21 DIAGNOSIS — J0110 Acute frontal sinusitis, unspecified: Secondary | ICD-10-CM

## 2023-10-21 LAB — COMPREHENSIVE METABOLIC PANEL WITH GFR
ALT: 16 IU/L (ref 0–32)
AST: 13 IU/L (ref 0–40)
Albumin: 4.2 g/dL (ref 3.9–4.9)
Alkaline Phosphatase: 54 IU/L (ref 41–116)
BUN/Creatinine Ratio: 14 (ref 9–23)
BUN: 10 mg/dL (ref 6–20)
Bilirubin Total: 0.3 mg/dL (ref 0.0–1.2)
CO2: 21 mmol/L (ref 20–29)
Calcium: 8.9 mg/dL (ref 8.7–10.2)
Chloride: 104 mmol/L (ref 96–106)
Creatinine, Ser: 0.72 mg/dL (ref 0.57–1.00)
Globulin, Total: 2.7 g/dL (ref 1.5–4.5)
Glucose: 94 mg/dL (ref 70–99)
Potassium: 4.6 mmol/L (ref 3.5–5.2)
Sodium: 137 mmol/L (ref 134–144)
Total Protein: 6.9 g/dL (ref 6.0–8.5)
eGFR: 112 mL/min/1.73 (ref >59)

## 2023-10-21 LAB — CBC WITH DIFFERENTIAL/PLATELET
Basophils Absolute: 0 x10E3/uL (ref 0.0–0.2)
Basos: 1 %
EOS (ABSOLUTE): 0.1 x10E3/uL (ref 0.0–0.4)
Eos: 2 %
Hematocrit: 40.2 % (ref 34.0–46.6)
Hemoglobin: 12.5 g/dL (ref 11.1–15.9)
Immature Grans (Abs): 0 x10E3/uL (ref 0.0–0.1)
Immature Granulocytes: 0 %
Lymphocytes Absolute: 2 x10E3/uL (ref 0.7–3.1)
Lymphs: 27 %
MCH: 26.2 pg — ABNORMAL LOW (ref 26.6–33.0)
MCHC: 31.1 g/dL — ABNORMAL LOW (ref 31.5–35.7)
MCV: 84 fL (ref 79–97)
Monocytes Absolute: 0.6 x10E3/uL (ref 0.1–0.9)
Monocytes: 7 %
Neutrophils Absolute: 4.7 x10E3/uL (ref 1.4–7.0)
Neutrophils: 63 %
Platelets: 324 x10E3/uL (ref 150–450)
RBC: 4.78 x10E6/uL (ref 3.77–5.28)
RDW: 13.1 % (ref 11.7–15.4)
WBC: 7.4 x10E3/uL (ref 3.4–10.8)

## 2023-10-21 LAB — SEDIMENTATION RATE: Sed Rate: 21 mm/h (ref 0–32)

## 2023-10-21 LAB — C-REACTIVE PROTEIN: CRP: 3 mg/L (ref 0–10)

## 2023-10-21 MED ORDER — AMOXICILLIN 875 MG PO TABS: 875.0000 mg | ORAL_TABLET | Freq: Two times a day (BID) | ORAL | 0 refills | Status: AC

## 2023-10-22 ENCOUNTER — Other Ambulatory Visit: Payer: Self-pay | Admitting: Family Medicine

## 2023-10-22 ENCOUNTER — Ambulatory Visit
Admission: RE | Admit: 2023-10-22 | Discharge: 2023-10-22 | Disposition: A | Discharge status: Home / Self Care | Source: Ambulatory Visit | Attending: Family Medicine | Admitting: Family Medicine

## 2023-10-22 DIAGNOSIS — R519 Headache, unspecified: Secondary | ICD-10-CM | POA: Diagnosis present

## 2023-10-22 DIAGNOSIS — H5711 Ocular pain, right eye: Secondary | ICD-10-CM

## 2023-10-22 DIAGNOSIS — H9201 Otalgia, right ear: Secondary | ICD-10-CM

## 2023-10-22 MED ORDER — IOHEXOL 300 MG/ML  SOLN
100.0000 mL | Freq: Once | INTRAMUSCULAR | Status: AC | PRN
  Administered 2023-10-22: 100 mL via INTRAVENOUS

## 2023-11-11 ENCOUNTER — Ambulatory Visit

## 2024-01-13 ENCOUNTER — Other Ambulatory Visit: Payer: Self-pay | Admitting: Obstetrics and Gynecology

## 2024-01-13 DIAGNOSIS — N946 Dysmenorrhea, unspecified: Secondary | ICD-10-CM
